# Patient Record
Sex: Male | Born: 1960 | Race: White | Hispanic: No | Marital: Married | Smoking: Never smoker
Health system: Southern US, Community
[De-identification: ages and names within clinical notes are randomized; demographics above are authoritative.]

## PROBLEM LIST (undated history)

## (undated) DIAGNOSIS — I38 Endocarditis, valve unspecified: Secondary | ICD-10-CM

## (undated) DIAGNOSIS — Z8739 Personal history of other diseases of the musculoskeletal system and connective tissue: Secondary | ICD-10-CM

## (undated) DIAGNOSIS — Z87442 Personal history of urinary calculi: Secondary | ICD-10-CM

## (undated) DIAGNOSIS — G4730 Sleep apnea, unspecified: Secondary | ICD-10-CM

## (undated) DIAGNOSIS — I10 Essential (primary) hypertension: Secondary | ICD-10-CM

## (undated) DIAGNOSIS — K219 Gastro-esophageal reflux disease without esophagitis: Secondary | ICD-10-CM

## (undated) DIAGNOSIS — I499 Cardiac arrhythmia, unspecified: Secondary | ICD-10-CM

## (undated) DIAGNOSIS — E785 Hyperlipidemia, unspecified: Secondary | ICD-10-CM

## (undated) HISTORY — DX: Gastro-esophageal reflux disease without esophagitis: K21.9

## (undated) HISTORY — DX: Cardiac arrhythmia, unspecified: I49.9

## (undated) HISTORY — DX: Endocarditis, valve unspecified: I38

## (undated) HISTORY — DX: Personal history of other diseases of the musculoskeletal system and connective tissue: Z87.39

## (undated) HISTORY — DX: Sleep apnea, unspecified: G47.30

## (undated) HISTORY — DX: Personal history of urinary calculi: Z87.442

## (undated) HISTORY — DX: Hyperlipidemia, unspecified: E78.5

## (undated) HISTORY — DX: Essential (primary) hypertension: I10

---

## 2004-12-08 ENCOUNTER — Ambulatory Visit: Payer: Self-pay | Admitting: Gastroenterology

## 2007-03-28 ENCOUNTER — Ambulatory Visit: Payer: Self-pay

## 2007-04-16 ENCOUNTER — Ambulatory Visit: Payer: Self-pay

## 2009-02-01 ENCOUNTER — Ambulatory Visit: Payer: Self-pay | Admitting: Specialist

## 2010-11-07 ENCOUNTER — Ambulatory Visit: Payer: Self-pay | Admitting: Internal Medicine

## 2010-11-12 DIAGNOSIS — Z87828 Personal history of other (healed) physical injury and trauma: Secondary | ICD-10-CM

## 2010-11-12 HISTORY — DX: Personal history of other (healed) physical injury and trauma: Z87.828

## 2010-11-30 ENCOUNTER — Emergency Department: Payer: Self-pay | Admitting: *Deleted

## 2011-02-02 ENCOUNTER — Other Ambulatory Visit: Payer: Self-pay | Admitting: Internal Medicine

## 2011-02-03 MED ORDER — AMLODIPINE-VALSARTAN-HCTZ 10-160-12.5 MG PO TABS: 1.0000 | ORAL_TABLET | Freq: Every day | ORAL | Status: DC

## 2011-02-07 ENCOUNTER — Other Ambulatory Visit: Payer: Self-pay | Admitting: Internal Medicine

## 2011-02-07 MED ORDER — AMLODIPINE-VALSARTAN-HCTZ 10-160-12.5 MG PO TABS: 1.0000 | ORAL_TABLET | Freq: Every day | ORAL | Status: DC

## 2011-02-10 ENCOUNTER — Other Ambulatory Visit: Payer: Self-pay | Admitting: Internal Medicine

## 2011-03-09 ENCOUNTER — Other Ambulatory Visit: Payer: Self-pay | Admitting: Internal Medicine

## 2011-07-13 ENCOUNTER — Other Ambulatory Visit: Payer: Self-pay | Admitting: Internal Medicine

## 2011-07-13 MED ORDER — SERTRALINE HCL 100 MG PO TABS: 100.0000 mg | ORAL_TABLET | Freq: Every day | ORAL | Status: DC

## 2011-09-12 HISTORY — PX: CARDIAC CATHETERIZATION: SHX172

## 2011-09-17 ENCOUNTER — Telehealth: Payer: Self-pay | Admitting: Internal Medicine

## 2011-09-17 NOTE — Telephone Encounter (Signed)
Caller: Allyson/Spouse; PCP: Duncan Dull; CB#: [phone]. Wife reports this gentleman was dizzy last week while they were on vacation. Caller is able to be up and about. Sxs persits today and caller would like an appt for him to see MD. Also has sxs of URI. Home Care per Dizziness Protocol, appt scheduled for Tues 5/7 at 9am with Dr Darrick Huntsman. Caller is agreeable.

## 2011-09-18 ENCOUNTER — Encounter: Payer: Self-pay | Admitting: Internal Medicine

## 2011-09-18 ENCOUNTER — Ambulatory Visit (INDEPENDENT_AMBULATORY_CARE_PROVIDER_SITE_OTHER): Payer: BC Managed Care – PPO | Admitting: Internal Medicine

## 2011-09-18 VITALS — BP 168/98 | HR 80 | Temp 97.6°F | Resp 16 | Ht 77.0 in | Wt 305.5 lb

## 2011-09-18 DIAGNOSIS — I499 Cardiac arrhythmia, unspecified: Secondary | ICD-10-CM | POA: Insufficient documentation

## 2011-09-18 DIAGNOSIS — I10 Essential (primary) hypertension: Secondary | ICD-10-CM

## 2011-09-18 DIAGNOSIS — R42 Dizziness and giddiness: Secondary | ICD-10-CM

## 2011-09-18 DIAGNOSIS — Z79899 Other long term (current) drug therapy: Secondary | ICD-10-CM

## 2011-09-18 DIAGNOSIS — I38 Endocarditis, valve unspecified: Secondary | ICD-10-CM | POA: Insufficient documentation

## 2011-09-18 MED ORDER — METOPROLOL SUCCINATE ER 50 MG PO TB24: 50.0000 mg | ORAL_TABLET | Freq: Every day | ORAL | Status: DC

## 2011-09-18 MED ORDER — AMLODIPINE-VALSARTAN-HCTZ 10-160-25 MG PO TABS: 1.0000 | ORAL_TABLET | Freq: Every day | ORAL | Status: DC

## 2011-09-18 NOTE — Patient Instructions (Signed)
Your  Blood pressure is very high and may be contributing to your dizziness.  I have refilled the exforge at a slightly higher dose and have refilled the toprol at 50 mg daily..  If your dizziness does not improve with use of Simply Saline twice daily , return in one week  FASTING SO WE CAN GET LABS

## 2011-09-18 NOTE — Progress Notes (Signed)
Patient ID: Edward Jensen, male   DOB: 28-Jun-1960, 51 y.o.   MRN: [id number]  Patient Active Problem List  Diagnoses  . Valvular disease  . Arrhythmia  . Dizziness - light-headed  . Hypertension    Subjective:  CC:   Chief Complaint  Patient presents with  . Dizziness    x one week    HPI:   Edward Vanbergen Rogersis a 51 y.o. male who presents With a 1 week history of light headedness accompanied by  mild sinus congestion and rhinitis/conjunctivitis.  Deneis headahces and true vertigo. He has noted occasional palpitations on days he forgets to take his metoprolol.  He has a history of a viral URI one month ago and had a syncopal episode after coughing and choking briefly on a piece of bread.   Past Medical History  Diagnosis Date  . Valvular disease   . Arrhythmia   . Hypertension   . GERD (gastroesophageal reflux disease)   . Arrhythmia     History reviewed. No pertinent past surgical history.       The following portions of the patient's history were reviewed and updated as appropriate: Allergies, current medications, and problem list.    Review of Systems:   12 Pt  review of systems was negative except those addressed in the HPI,     History   Social History  . Marital Status: Married    Spouse Name: Allyson      Number of Children: N/A  . Years of Education: N/A   Occupational History  . carpenter    Social History Main Topics  . Smoking status: Never Smoker   . Smokeless tobacco: Current User    Types: Chew  . Alcohol Use: 2.5 oz/week    5 drink(s) per week  . Drug Use: No  . Sexually Active: Not on file   Other Topics Concern  . Not on file   Social History Narrative  . No narrative on file    Objective:  BP 168/98  Pulse 80  Temp(Src) 97.6 F (36.4 C) (Oral)  Resp 16  Ht 6\' 5"  (1.956 m)  Wt 305 lb 8 oz (138.574 kg)  BMI 36.23 kg/m2  SpO2 96%  General appearance: alert, cooperative and appears stated age Ears: normal TM's and  external ear canals both ears Throat: lips, mucosa, and tongue normal; teeth and gums normal Neck: no adenopathy, no carotid bruit, supple, symmetrical, trachea midline and thyroid not enlarged, symmetric, no tenderness/mass/nodules Back: symmetric, no curvature. ROM normal. No CVA tenderness. Lungs: clear to auscultation bilaterally Heart: regular rate and rhythm, S1, S2 normal, no murmur, click, rub or gallop Abdomen: soft, non-tender; bowel sounds normal; no masses,  no organomegaly Pulses: 2+ and symmetric Skin: Skin color, texture, turgor normal. No rashes or lesions Lymph nodes: Cervical, supraclavicular, and axillary nodes normal. Neuro: grossly nonfocal.  Normal cerebellar function, negative romberg  Assessment and Plan:  Hypertension Uncontrolled on current regimen, which mauy be contributing to his symptoms.  Will increase toprol and losartan slightly.  Return in one week.   Dizziness - light-headed His neurologic exam is normal but he has evidence of allergic rhinitis on exam and his bp is uncontrolled. Will treat for cirla URI and and treat his uncontrolled HTN. Return in one week .    Updated Medication List Outpatient Encounter Prescriptions as of 09/18/2011  Medication Sig Dispense Refill  . esomeprazole (NEXIUM) 40 MG capsule Take 40 mg by mouth daily before breakfast.      .  metoprolol succinate (TOPROL-XL) 50 MG 24 hr tablet Take 1 tablet (50 mg total) by mouth daily.  30 tablet  6  . sertraline (ZOLOFT) 100 MG tablet Take 1 tablet (100 mg total) by mouth daily.  90 tablet  3  . testosterone cypionate (DEPOTESTOTERONE CYPIONATE) 200 MG/ML injection INJECT ONE ML IM ONCE A WEEK            AS DIRECTED BY PHYSICIAN  10 mL  3  . DISCONTD: Amlodipine-Valsartan-HCTZ (EXFORGE HCT) 10-160-12.5 MG TABS Take 1 tablet by mouth daily.  30 tablet  3  . DISCONTD: metoprolol succinate (TOPROL-XL) 25 MG 24 hr tablet TAKE 1 TABLET BY MOUTH EVERY DAY  30 tablet  6  .  Amlodipine-Valsartan-HCTZ (EXFORGE HCT) 10-160-25 MG TABS Take 1 capsule by mouth daily.  30 tablet  5  . CIALIS 20 MG tablet       . clarithromycin (BIAXIN) 500 MG tablet       . promethazine-dextromethorphan (PROMETHAZINE-DM) 6.25-15 MG/5ML syrup          Orders Placed This Encounter  Procedures  . Lipid panel  . COMPLETE METABOLIC PANEL WITH GFR    No Follow-up on file.

## 2011-09-19 ENCOUNTER — Encounter: Payer: Self-pay | Admitting: Internal Medicine

## 2011-09-19 DIAGNOSIS — I10 Essential (primary) hypertension: Secondary | ICD-10-CM | POA: Insufficient documentation

## 2011-09-19 DIAGNOSIS — R42 Dizziness and giddiness: Secondary | ICD-10-CM | POA: Insufficient documentation

## 2011-09-19 NOTE — Assessment & Plan Note (Signed)
Uncontrolled on current regimen, which mauy be contributing to his symptoms.  Will increase toprol and losartan slightly.  Return in one week.

## 2011-09-19 NOTE — Assessment & Plan Note (Signed)
His neurologic exam is normal but he has evidence of allergic rhinitis on exam and his bp is uncontrolled. Will treat for cirla URI and and treat his uncontrolled HTN. Return in one week .

## 2011-09-25 ENCOUNTER — Other Ambulatory Visit: Payer: Self-pay | Admitting: Internal Medicine

## 2011-09-25 ENCOUNTER — Ambulatory Visit (INDEPENDENT_AMBULATORY_CARE_PROVIDER_SITE_OTHER): Payer: BC Managed Care – PPO | Admitting: Internal Medicine

## 2011-09-25 ENCOUNTER — Ambulatory Visit (INDEPENDENT_AMBULATORY_CARE_PROVIDER_SITE_OTHER)
Admission: RE | Admit: 2011-09-25 | Discharge: 2011-09-25 | Disposition: A | Discharge status: Home / Self Care | Payer: BC Managed Care – PPO | Source: Ambulatory Visit | Attending: Internal Medicine | Admitting: Internal Medicine

## 2011-09-25 ENCOUNTER — Encounter: Payer: Self-pay | Admitting: Internal Medicine

## 2011-09-25 VITALS — BP 138/92 | HR 84 | Temp 98.2°F | Resp 18 | Wt 298.5 lb

## 2011-09-25 DIAGNOSIS — I10 Essential (primary) hypertension: Secondary | ICD-10-CM

## 2011-09-25 DIAGNOSIS — R0600 Dyspnea, unspecified: Secondary | ICD-10-CM

## 2011-09-25 DIAGNOSIS — R42 Dizziness and giddiness: Secondary | ICD-10-CM

## 2011-09-25 DIAGNOSIS — Z87442 Personal history of urinary calculi: Secondary | ICD-10-CM | POA: Insufficient documentation

## 2011-09-25 DIAGNOSIS — Z87828 Personal history of other (healed) physical injury and trauma: Secondary | ICD-10-CM | POA: Insufficient documentation

## 2011-09-25 DIAGNOSIS — R0609 Other forms of dyspnea: Secondary | ICD-10-CM

## 2011-09-25 DIAGNOSIS — R0989 Other specified symptoms and signs involving the circulatory and respiratory systems: Secondary | ICD-10-CM

## 2011-09-25 MED ORDER — TESTOSTERONE CYPIONATE 200 MG/ML IM SOLN: 200.0000 mg | INTRAMUSCULAR | Status: DC

## 2011-09-25 NOTE — Progress Notes (Signed)
Patient ID: Edward Jensen, male   DOB: 08/13/1960, 51 y.o.   MRN: [id number]  Patient Active Problem List  Diagnoses  . Valvular disease  . Arrhythmia  . Dizziness - light-headed  . Hypertension  . H/O renal calculi  . H/O dislocation of elbow    Subjective:  CC:   Chief Complaint  Patient presents with  . Follow-up    HPI:   Edward Jensen is a 51 y.o. male who presents  for one week followup on dizziness and uncontrolled hypertension due to loss to follow up and lapse in medications. During the interim he had an episode of dzziness and chest tightness/dyspnea and was evaluated in  the ER at [facility] with an EKG which was  apparently normal per patient.  He was checked for orthostatic hypotension  told to reduce his dose of metoprolol for unclear reasons, which he did not do. He was reportedly not orthostatic   No chest x ray done.  He now reports that he has had chest tightness on and off for the past two 2 weeks.  He is a very difficult historian and unfortunately his  wife is again not with him. His last Stress test was more than 4 years ago,  Reportedly normal .   Past Medical History  Diagnosis Date  . Valvular disease   . Arrhythmia   . Hypertension   . GERD (gastroesophageal reflux disease)   . Arrhythmia   . H/O renal calculi   . H/O dislocation of elbow July 2012    s/p closed reduction in ER     History reviewed. No pertinent past surgical history.       The following portions of the patient's history were reviewed and updated as appropriate: Allergies, current medications, and problem list.    Review of Systems:   12 Pt  review of systems was negative except those addressed in the HPI,     History   Social History  . Marital Status: Married    Spouse Name: Allyson      Number of Children: N/A  . Years of Education: N/A   Occupational History  . carpenter    Social History Main Topics  . Smoking status: Never Smoker   . Smokeless  tobacco: Current User    Types: Chew  . Alcohol Use: 2.5 oz/week    5 drink(s) per week  . Drug Use: No  . Sexually Active: Not on file   Other Topics Concern  . Not on file   Social History Narrative  . No narrative on file    Objective:  BP 138/92  Pulse 84  Temp(Src) 98.2 F (36.8 C) (Oral)  Resp 18  Wt 298 lb 8 oz (135.399 kg)  SpO2 96%  General appearance: alert, cooperative and appears stated age Ears: normal TM's and external ear canals both ears Throat: lips, mucosa, and tongue normal; teeth and gums normal Neck: no adenopathy, no carotid bruit, supple, symmetrical, trachea midline and thyroid not enlarged, symmetric, no tenderness/mass/nodules Back: symmetric, no curvature. ROM normal. No CVA tenderness. Lungs: clear to auscultation bilaterally Heart: regular rate and rhythm, S1, S2 normal, no murmur, click, rub or gallop Abdomen: soft, non-tender; bowel sounds normal; no masses,  no organomegaly Pulses: 2+ and symmetric Skin: Skin color, texture, turgor normal. No rashes or lesions Lymph nodes: Cervical, supraclavicular, and axillary nodes normal.  Assessment and Plan:  Dizziness - light-headed His recurrent episodes of dizziness and chest tightness are concerning  for angina.  His chest x ray showed mild cardiomegaly.  He was evaluated in the ER at the [facility] with an EKG which was reportedly normal. i am referring him to cardiology for risk stratification, as he has not had a stress test in years and no prior catheterization. .   Hypertension Uncontrolled at last visit due to lapse in medications,  His bp has improved but is not at goal on increased dose of metoprolol and maximal Exforge (amlodipine valsartan HCTZ) .  He will need a rule out of secondary causes includign RAS at follow up unless he undergoes cardiac cath and the renals can be evaluated during procedure.     Updated Medication List Outpatient Encounter Prescriptions as of 09/25/2011    Medication Sig Dispense Refill  . Amlodipine-Valsartan-HCTZ (EXFORGE HCT) 10-160-25 MG TABS Take 1 capsule by mouth daily.  30 tablet  5  . CIALIS 20 MG tablet       . clarithromycin (BIAXIN) 500 MG tablet       . esomeprazole (NEXIUM) 40 MG capsule Take 40 mg by mouth daily before breakfast.      . metoprolol succinate (TOPROL-XL) 50 MG 24 hr tablet Take 1 tablet (50 mg total) by mouth daily.  30 tablet  6  . promethazine-dextromethorphan (PROMETHAZINE-DM) 6.25-15 MG/5ML syrup       . sertraline (ZOLOFT) 100 MG tablet Take 1 tablet (100 mg total) by mouth daily.  90 tablet  3  . DISCONTD: testosterone cypionate (DEPOTESTOTERONE CYPIONATE) 200 MG/ML injection INJECT ONE ML IM ONCE A WEEK            AS DIRECTED BY PHYSICIAN  10 mL  3     Orders Placed This Encounter  Procedures  . DG Chest 2 View  . CK  . Troponin I  . CKMB  . Troponin I  . CK  . CK Total (and CKMB)  . Ambulatory referral to Cardiology    No Follow-up on file.

## 2011-09-25 NOTE — Assessment & Plan Note (Addendum)
His recurrent episodes of dizziness and chest tightness are concerning for angina.  His chest x ray showed mild cardiomegaly.  He was evaluated in the ER at the [facility] with an EKG which was reportedly normal. i am referring him to cardiology for risk stratification, as he has not had a stress test in years and no prior catheterization. [name]

## 2011-09-25 NOTE — Assessment & Plan Note (Addendum)
Uncontrolled at last visit due to lapse in medications,  His bp has improved but is not at goal on increased dose of metoprolol and maximal Exforge (amlodipine valsartan HCTZ) .  He will need a rule out of secondary causes includign RAS at follow up unless he undergoes cardiac cath and the renals can be evaluated during procedure.

## 2011-09-26 LAB — CK TOTAL AND CKMB (NOT AT ARMC): CK, MB: 1.8 ng/mL (ref 0.3–4.0)

## 2011-09-26 LAB — COMPREHENSIVE METABOLIC PANEL
AST: 28 U/L (ref 0–37)
Albumin: 4.8 g/dL (ref 3.5–5.2)
BUN: 18 mg/dL (ref 6–23)
Calcium: 9.4 mg/dL (ref 8.4–10.5)
Chloride: 98 mEq/L (ref 96–112)
Creat: 1.29 mg/dL (ref 0.50–1.35)
Glucose, Bld: 95 mg/dL (ref 70–99)
Potassium: 5.3 mEq/L (ref 3.5–5.3)

## 2011-09-26 LAB — LIPID PANEL
Cholesterol: 214 mg/dL — ABNORMAL HIGH (ref 0–200)
HDL: 33 mg/dL — ABNORMAL LOW (ref >39)
Total CHOL/HDL Ratio: 6.5 Ratio
Triglycerides: 425 mg/dL — ABNORMAL HIGH (ref <150)

## 2011-09-26 MED ORDER — ROSUVASTATIN CALCIUM 10 MG PO TABS: 10.0000 mg | ORAL_TABLET | Freq: Every day | ORAL | Status: DC

## 2011-09-26 NOTE — Progress Notes (Signed)
Addended by: Duncan Dull on: 09/26/2011 11:35 PM   Modules accepted: Orders

## 2011-10-01 ENCOUNTER — Other Ambulatory Visit: Payer: Self-pay | Admitting: *Deleted

## 2011-10-01 ENCOUNTER — Encounter: Payer: Self-pay | Admitting: Cardiovascular Disease

## 2011-10-01 ENCOUNTER — Ambulatory Visit (INDEPENDENT_AMBULATORY_CARE_PROVIDER_SITE_OTHER): Payer: BC Managed Care – PPO | Admitting: Cardiovascular Disease

## 2011-10-01 VITALS — BP 136/89 | HR 74 | Ht 77.0 in | Wt 294.0 lb

## 2011-10-01 DIAGNOSIS — R079 Chest pain, unspecified: Secondary | ICD-10-CM | POA: Insufficient documentation

## 2011-10-01 DIAGNOSIS — I38 Endocarditis, valve unspecified: Secondary | ICD-10-CM

## 2011-10-01 DIAGNOSIS — I10 Essential (primary) hypertension: Secondary | ICD-10-CM

## 2011-10-01 MED ORDER — TESTOSTERONE CYPIONATE 200 MG/ML IM SOLN: 200.0000 mg | INTRAMUSCULAR | Status: DC

## 2011-10-01 MED ORDER — ASPIRIN EC 81 MG PO TBEC: 81.0000 mg | DELAYED_RELEASE_TABLET | Freq: Every day | ORAL | Status: AC

## 2011-10-01 NOTE — Assessment & Plan Note (Signed)
The patient has refractory hypertension and currently is on 4 different blood pressure medications including a diuretic. It is possible that his diet, sleep apnea and weight might be contributing to difficulty in controlling his blood pressure. However, secondary hypertension should be evaluated as well. I will plan on performing nonselective renal artery angiography at the time of cardiac catheterization.

## 2011-10-01 NOTE — Assessment & Plan Note (Signed)
The patient's recent symptoms of exertional chest pain and dyspnea is highly worrisome for angina. He has cut down on his physical activity significantly due to the symptoms. He does have multiple risk factors for coronary artery disease including family history of premature CAD, hypertension, uncontrolled hyperlipidemia and obesity. He also uses tobacco although he does not smoke. I recommend starting aspirin 81 mg once daily. Due to his current symptoms and risk factors, I recommend proceeding with cardiac catheterization and possible coronary intervention. Risks, benefits and alternatives were discussed with the patient. I don't think a stress test would be helpful in his situation due to high pretest probability of coronary artery disease.

## 2011-10-01 NOTE — Patient Instructions (Addendum)
Left heart cath at [facility].  Follow up 2 weeks after cardiac cath.   Your physician has requested that you have a cardiac catheterization. Cardiac catheterization is used to diagnose and/or treat various heart conditions. Doctors may recommend this procedure for a number of different reasons. The most common reason is to evaluate chest pain. Chest pain can be a symptom of coronary artery disease (CAD), and cardiac catheterization can show whether plaque is narrowing or blocking your heart's arteries. This procedure is also used to evaluate the valves, as well as measure the blood flow and oxygen levels in different parts of your heart. For further information please visit [email or web address] Please follow instruction sheet, as given.  Start Aspirin 81 mg daily

## 2011-10-01 NOTE — Progress Notes (Signed)
HPI  This is a 51 year old man who is referred by Dr. Darrick Huntsman for evaluation of chest pain, dyspnea and dizziness. The patient reports prolonged history of palpitations without documented arrhythmia which has been controlled with metoprolol. He was told in the past also about an EEG heart valve. He had a stress test done many years ago but not recently. Over the last few weeks, he had few episodes of substernal chest tightness which happens at rest as well as with activities. He noticed increased exertional dyspnea and dizziness. He had not been feeling well and has cut down on his physical activities due to the symptoms. He is not aware of any previous history of ischemic heart disease. He does have prolonged history of hypertension which has been difficult to control. He has sleep apnea and uses CPAP in a regular basis. He has family history of premature coronary artery disease and was recently found to have significant hyperlipidemia with triglyceride above 400. The patient does not follow a healthy lifestyle. He does not smoke but he dips. He denies syncope or presyncope.  No Known Allergies   Current Outpatient Prescriptions on File Prior to Visit  Medication Sig Dispense Refill  . Amlodipine-Valsartan-HCTZ (EXFORGE HCT) 10-160-25 MG TABS Take 1 capsule by mouth daily.  30 tablet  5  . CIALIS 20 MG tablet Take 10 mg by mouth daily as needed.       [name] esomeprazole (NEXIUM) 40 MG capsule Take 40 mg by mouth daily before breakfast.      . metoprolol succinate (TOPROL-XL) 50 MG 24 hr tablet Take 1 tablet (50 mg total) by mouth daily.  30 tablet  6  . rosuvastatin (CRESTOR) 10 MG tablet Take 1 tablet (10 mg total) by mouth daily.  30 tablet  3  . sertraline (ZOLOFT) 100 MG tablet Take 1 tablet (100 mg total) by mouth daily.  90 tablet  3  . testosterone cypionate (DEPOTESTOTERONE CYPIONATE) 200 MG/ML injection Inject 1 mL (200 mg total) into the muscle once a week.  10 mL  3     Past Medical  History  Diagnosis Date  . Valvular disease   . Hypertension   . GERD (gastroesophageal reflux disease)   . H/O renal calculi   . H/O dislocation of elbow July 2012    s/p closed reduction in ER   . Sleep apnea     on CPAP  . Hyperlipidemia   . palpitations      History reviewed. No pertinent past surgical history.   Family History  Problem Relation Age of Onset  . Cancer Father     Lung  . Heart disease Father   . Heart attack Father   . Heart disease Paternal Grandfather   . Heart attack Mother      History   Social History  . Marital Status: Married    Spouse Name: Allyson      Number of Children: N/A  . Years of Education: N/A   Occupational History  . carpenter    Social History Main Topics  . Smoking status: Never Smoker   . Smokeless tobacco: Current User    Types: Chew  . Alcohol Use: 2.5 oz/week    5 drink(s) per week  . Drug Use: No  . Sexually Active: Not on file   Other Topics Concern  . Not on file   Social History Narrative  . No narrative on file     ROS Constitutional: Negative for fever,  chills, diaphoresis, activity change, appetite change and fatigue.  HENT: Negative for hearing loss, nosebleeds, congestion, sore throat, facial swelling, drooling, trouble swallowing, neck pain, voice change, sinus pressure and tinnitus.  Eyes: Negative for photophobia, pain, discharge and visual disturbance.  Respiratory: Negative for apnea, cough and wheezing.  Cardiovascular: Negative for  and leg swelling.  Gastrointestinal: Negative for nausea, vomiting, abdominal pain, diarrhea, constipation, blood in stool and abdominal distention.  Genitourinary: Negative for dysuria, urgency, frequency, hematuria and decreased urine volume.  Musculoskeletal: Negative for myalgias, back pain, joint swelling, arthralgias and gait problem.  Skin: Negative for color change, pallor, rash and wound.  Neurological: Negative for dizziness, tremors, seizures,  syncope, speech difficulty, weakness, light-headedness, numbness and headaches.  Psychiatric/Behavioral: Negative for suicidal ideas, hallucinations, behavioral problems and agitation. The patient is not nervous/anxious.     PHYSICAL EXAM   BP 136/89  Pulse 74  Ht 6\' 5"  (1.956 m)  Wt 294 lb (133.358 kg)  BMI 34.86 kg/m2 Constitutional: He is oriented to person, place, and time. He appears well-developed and well-nourished. No distress.  HENT: No nasal discharge.  Head: Normocephalic and atraumatic.  Eyes: Pupils are equal and round. Right eye exhibits no discharge. Left eye exhibits no discharge.  Neck: Normal range of motion. Neck supple. No JVD present. No thyromegaly present. No carotid bruits. Cardiovascular: Normal rate, regular rhythm, normal heart sounds. Exam reveals no gallop and no friction rub. No murmur heard.  Pulmonary/Chest: Effort normal and breath sounds normal. No stridor. No respiratory distress. He has no wheezes. He has no rales. He exhibits no tenderness.  Abdominal: Soft. Bowel sounds are normal. He exhibits no distension. There is no tenderness. There is no rebound and no guarding.  Musculoskeletal: Normal range of motion. He exhibits no edema and no tenderness.  Neurological: He is alert and oriented to person, place, and time. Coordination normal.  Skin: Skin is warm and dry. No rash noted. He is not diaphoretic. No erythema. No pallor.  Psychiatric: He has a normal mood and affect. His behavior is normal. Judgment and thought content normal.       EKG: Sinus  Rhythm  WITHIN NORMAL LIMITS   ASSESSMENT AND PLAN

## 2011-10-01 NOTE — Assessment & Plan Note (Signed)
The patient was told about the leaky heart valve in the past. This will be evaluated during cardiac catheterization but he might ultimately require an echocardiogram.

## 2011-10-02 LAB — BASIC METABOLIC PANEL
BUN/Creatinine Ratio: 17 (ref 9–20)
Chloride: 102 mmol/L (ref 97–108)
GFR calc Af Amer: 82 mL/min/{1.73_m2} (ref >59)
GFR calc non Af Amer: 71 mL/min/{1.73_m2} (ref >59)
Potassium: 4.2 mmol/L (ref 3.5–5.2)
Sodium: 140 mmol/L (ref 134–144)

## 2011-10-02 LAB — CBC WITH DIFFERENTIAL/PLATELET
Eos: 4 % (ref 0–7)
Eosinophils Absolute: 0.2 10*3/uL (ref 0.0–0.4)
HCT: 48.8 % (ref 37.5–51.0)
Immature Granulocytes: 0 % (ref 0–2)
Lymphocytes Absolute: 1.2 10*3/uL (ref 0.7–4.5)
MCH: 31.1 pg (ref 26.6–33.0)
MCHC: 34.6 g/dL (ref 31.5–35.7)
MCV: 90 fL (ref 79–97)
Monocytes Absolute: 0.8 10*3/uL (ref 0.1–1.0)
Neutrophils Relative %: 56 % (ref 40–74)
RDW: 13.4 % (ref 12.3–15.4)
WBC: 5.1 10*3/uL (ref 4.0–10.5)

## 2011-10-03 ENCOUNTER — Telehealth: Payer: Self-pay | Admitting: Internal Medicine

## 2011-10-03 MED ORDER — TESTOSTERONE CYPIONATE 200 MG/ML IM SOLN: 200.0000 mg | INTRAMUSCULAR | Status: DC

## 2011-10-03 NOTE — Telephone Encounter (Signed)
[phone] Pt spouse called checking on pt testrone rx. Pt is out of meds [facility]  He received his crestor not the testrone  This week Please advise

## 2011-10-05 ENCOUNTER — Ambulatory Visit: Payer: Self-pay | Admitting: Cardiovascular Disease

## 2011-10-05 DIAGNOSIS — I739 Peripheral vascular disease, unspecified: Secondary | ICD-10-CM

## 2011-10-05 DIAGNOSIS — I200 Unstable angina: Secondary | ICD-10-CM

## 2011-10-26 ENCOUNTER — Encounter: Payer: Self-pay | Admitting: Cardiovascular Disease

## 2011-10-26 ENCOUNTER — Ambulatory Visit: Payer: BC Managed Care – PPO | Admitting: Cardiovascular Disease

## 2011-10-26 ENCOUNTER — Ambulatory Visit (INDEPENDENT_AMBULATORY_CARE_PROVIDER_SITE_OTHER): Payer: BC Managed Care – PPO | Admitting: Cardiovascular Disease

## 2011-10-26 VITALS — BP 124/90 | HR 83 | Ht 77.0 in | Wt 293.0 lb

## 2011-10-26 DIAGNOSIS — I10 Essential (primary) hypertension: Secondary | ICD-10-CM

## 2011-10-26 DIAGNOSIS — I38 Endocarditis, valve unspecified: Secondary | ICD-10-CM

## 2011-10-26 DIAGNOSIS — I499 Cardiac arrhythmia, unspecified: Secondary | ICD-10-CM

## 2011-10-26 NOTE — Assessment & Plan Note (Signed)
His blood pressure is reasonably controlled on current medications. I advised him to start an exercise program and attempt weight loss. There was no evidence of renal artery stenosis on recent angiogram.

## 2011-10-26 NOTE — Patient Instructions (Addendum)
Your heart catheterization was fine.  Start an exercise program at least 4 days a week.   Follow up as needed.

## 2011-10-26 NOTE — Assessment & Plan Note (Signed)
Cardiac catheterization showed no significant mitral regurgitation or aortic stenosis. No further cardiac workup at this time. Followup with [place] as needed.

## 2011-10-26 NOTE — Progress Notes (Signed)
HPI  This is a 51 year old man who is here today for a followup visit regarding chest pain, dyspnea and dizziness. The patient reports prolonged history of palpitations without documented arrhythmia which has been controlled with metoprolol.  He does have prolonged history of hypertension which has been difficult to control. He has sleep apnea and uses CPAP in a regular basis. He has family history of premature coronary artery disease and was recently found to have significant hyperlipidemia with triglyceride above 400. The patient does not follow a healthy lifestyle. He does not smoke but he dips.  He underwent a left heart catheterization through the right radial artery recently which showed no significant coronary artery disease. There was minor plaque buildup and luminal irregularities. Ejection fraction was normal without evidence of mitral regurgitation or aortic stenosis. Abdominal aortogram showed no evidence of aortic aneurysm or renal artery stenosis.   No Known Allergies   Current Outpatient Prescriptions on File Prior to Visit  Medication Sig Dispense Refill  . Amlodipine-Valsartan-HCTZ (EXFORGE HCT) 10-160-25 MG TABS Take 1 capsule by mouth daily.  30 tablet  5  . aspirin EC 81 MG tablet Take 1 tablet (81 mg total) by mouth daily.  90 tablet  3  . esomeprazole (NEXIUM) 40 MG capsule Take 40 mg by mouth daily before breakfast.      . metoprolol succinate (TOPROL-XL) 50 MG 24 hr tablet Take 1 tablet (50 mg total) by mouth daily.  30 tablet  6  . rosuvastatin (CRESTOR) 10 MG tablet Take 1 tablet (10 mg total) by mouth daily.  30 tablet  3  . testosterone cypionate (DEPOTESTOTERONE CYPIONATE) 200 MG/ML injection Inject 1 mL (200 mg total) into the muscle once a week.  10 mL  3  . sertraline (ZOLOFT) 100 MG tablet Take 1 tablet (100 mg total) by mouth daily.  90 tablet  3     Past Medical History  Diagnosis Date  . Valvular disease   . Hypertension   . GERD (gastroesophageal  reflux disease)   . H/O renal calculi   . H/O dislocation of elbow July 2012    s/p closed reduction in ER   . Sleep apnea     on CPAP  . Hyperlipidemia   . palpitations      Past Surgical History  Procedure Date  . Cardiac catheterization May 2013     Family History  Problem Relation Age of Onset  . Cancer Father     Lung  . Heart disease Father   . Heart attack Father   . Heart disease Paternal Grandfather   . Heart attack Mother      History   Social History  . Marital Status: Married    Spouse Name: Allyson      Number of Children: N/A  . Years of Education: N/A   Occupational History  . carpenter    Social History Main Topics  . Smoking status: Never Smoker   . Smokeless tobacco: Current User    Types: Chew  . Alcohol Use: 2.5 oz/week    5 drink(s) per week  . Drug Use: No  . Sexually Active: Not on file   Other Topics Concern  . Not on file   Social History Narrative  . No narrative on file     PHYSICAL EXAM   BP 124/90  Pulse 83  Ht 6\' 5"  (1.956 m)  Wt 293 lb (132.904 kg)  BMI 34.74 kg/m2  Constitutional: He is oriented  to person, place, and time. He appears well-developed and well-nourished. No distress.  HENT: No nasal discharge.  Head: Normocephalic and atraumatic.  Eyes: Pupils are equal and round. Right eye exhibits no discharge. Left eye exhibits no discharge.  Neck: Normal range of motion. Neck supple. No JVD present. No thyromegaly present.  Cardiovascular: Normal rate, regular rhythm, normal heart sounds. Exam reveals no gallop and no friction rub. No murmur heard.  Pulmonary/Chest: Effort normal and breath sounds normal. No stridor. No respiratory distress. He has no wheezes. He has no rales. He exhibits no tenderness.  Abdominal: Soft. Bowel sounds are normal. He exhibits no distension. There is no tenderness. There is no rebound and no guarding.  Musculoskeletal: Normal range of motion. He exhibits no edema and no tenderness.   Neurological: He is alert and oriented to person, place, and time. Coordination normal.  Skin: Skin is warm and dry. No rash noted. He is not diaphoretic. No erythema. No pallor.  Psychiatric: He has a normal mood and affect. His behavior is normal. Judgment and thought content normal.  Right radial pulse is normal with no hematoma.    EKG: Sinus  Rhythm  -  Nonspecific T-abnormality.    ASSESSMENT AND PLAN

## 2011-10-26 NOTE — Assessment & Plan Note (Signed)
This has improved significantly and is not cardiac in nature. Cardiac catheterization showed no evidence of obstructive coronary artery disease. I discussed with him the importance of lifestyle changes, healthy diet and starting a regular exercise program.

## 2012-01-15 ENCOUNTER — Other Ambulatory Visit: Payer: Self-pay | Admitting: *Deleted

## 2012-01-15 MED ORDER — ESOMEPRAZOLE MAGNESIUM 40 MG PO CPDR: 40.0000 mg | DELAYED_RELEASE_CAPSULE | Freq: Every day | ORAL | Status: DC

## 2012-02-04 ENCOUNTER — Ambulatory Visit (INDEPENDENT_AMBULATORY_CARE_PROVIDER_SITE_OTHER): Payer: BC Managed Care – PPO | Admitting: Internal Medicine

## 2012-02-04 ENCOUNTER — Telehealth: Payer: Self-pay | Admitting: Internal Medicine

## 2012-02-04 ENCOUNTER — Encounter: Payer: Self-pay | Admitting: Internal Medicine

## 2012-02-04 VITALS — BP 128/84 | HR 78 | Temp 98.4°F | Resp 12 | Wt 291.5 lb

## 2012-02-04 DIAGNOSIS — A09 Infectious gastroenteritis and colitis, unspecified: Secondary | ICD-10-CM | POA: Insufficient documentation

## 2012-02-04 DIAGNOSIS — K469 Unspecified abdominal hernia without obstruction or gangrene: Secondary | ICD-10-CM | POA: Insufficient documentation

## 2012-02-04 DIAGNOSIS — R52 Pain, unspecified: Secondary | ICD-10-CM

## 2012-02-04 LAB — POCT INFLUENZA A/B: Influenza B, POC: NEGATIVE

## 2012-02-04 MED ORDER — METRONIDAZOLE 500 MG PO TABS: 500.0000 mg | ORAL_TABLET | Freq: Three times a day (TID) | ORAL | Status: DC

## 2012-02-04 MED ORDER — ESOMEPRAZOLE MAGNESIUM 40 MG PO CPDR: 40.0000 mg | DELAYED_RELEASE_CAPSULE | Freq: Two times a day (BID) | ORAL | Status: DC

## 2012-02-04 MED ORDER — CIPROFLOXACIN HCL 500 MG PO TABS: 500.0000 mg | ORAL_TABLET | Freq: Two times a day (BID) | ORAL | Status: DC

## 2012-02-04 NOTE — Telephone Encounter (Signed)
°  Caller: Caryl Ada; Patient Name: Edward Jensen; PCP: Duncan Dull (Adults only); Best Callback Phone Number: [phone]; Reason for call: patient has been sick since 01/30/12 with flu like symptoms and diarrhea.  Still having diarrhea.  Since MN last night 02/03/12 he has had 5 bowel  movements.   He last voided this AM and he has been drinking Gatoraide.  Afebrile.  Triaged Diarrhea or Other Changes in Bowel Habits.  Needs to be seen in the emergency room for  severe apin/cramping in abdomen or rectum that interferes with ability to carry out normal activities or sleep.  Appointment made for today, 02/04/12 at 1130 in with [place].

## 2012-02-04 NOTE — Assessment & Plan Note (Signed)
Chronic, secondary to blunt trauma over 5 years ago. It is non-incarcerated non-reducible, and nonpainful. Observation for now.

## 2012-02-04 NOTE — Patient Instructions (Addendum)
I have called in two prescriptions for your diarrhea to take for 7 to 10 days   Please follow the dietary recommendations to help your diarrhea resolve.

## 2012-02-04 NOTE — Progress Notes (Signed)
Patient ID: Edward Jensen, male   DOB: [birth year], 51 y.o.   MRN: [id number]  Patient Active Problem List  Diagnosis  . Valvular disease  . Arrhythmia  . Dizziness - light-headed  . Hypertension  . H/O renal calculi  . H/O dislocation of elbow  . Chest pain  . Diarrhea, infectious, adult  . Abdominal hernia    Subjective:  CC:   Chief Complaint  Patient presents with  . Diarrhea    x 5 days; no blood; immodium no help    HPI:   Edward Veney Rogersis a 51 y.o. male who presents for evaluation of diarrhea .  Patient describes sudden onset of nonbloody diarrhea which started 5 days ago accompanied by abdominal cramping. He had a fever 102 Wednesday night and had fevers again on Thursday but these have resolved. His diarrhea is described as watery nonbloody and occurs whenever he eats. He has not modified his diet very much. He does have a history of sick contacts. He helped an elderly male with Parkinson's disease at his house a day or 2 after the patient had recovered from a diarrheal illness. Body aches and chills are better. Nausea has resolved..  Wife had diarrhea the week before., with no other symptoms. 2) abdominal wall swelling.  Present for 5 years.  Started after blunt trauma to the abdomen when a 2 x 6 piece of lumber flew out of a sawmill machine and hit him  in the abdomen. Initially he was in a lot of pain and had a very large bruise on his abdomen. He was evaluated medically at the time. For the last several years now he has had a area on his abdomen that bulges at times. It is not painful.   Past Medical History  Diagnosis Date  . Valvular disease   . Hypertension   . GERD (gastroesophageal reflux disease)   . H/O renal calculi   . H/O dislocation of elbow July 2012    s/p closed reduction in ER   . Sleep apnea     on CPAP  . Hyperlipidemia   . palpitations     Past Surgical History  Procedure Date  . Cardiac catheterization May 2013         The following  portions of the patient's history were reviewed and updated as appropriate: Allergies, current medications, and problem list.    Review of Systems:   12 Pt  review of systems was negative except those addressed in the HPI,     History   Social History  . Marital Status: Married    Spouse Name: Allyson      Number of Children: N/A  . Years of Education: N/A   Occupational History  . carpenter    Social History Main Topics  . Smoking status: Never Smoker   . Smokeless tobacco: Current User    Types: Chew  . Alcohol Use: 2.5 oz/week    5 drink(s) per week     Regular  . Drug Use: No  . Sexually Active: Not on file   Other Topics Concern  . Not on file   Social History Narrative  . No narrative on file    Objective:  BP 128/84  Pulse 78  Temp 98.4 F (36.9 C) (Oral)  Resp 12  Wt 291 lb 8 oz (132.224 kg)  SpO2 96%  General appearance: alert, cooperative and appears stated age Ears: normal TM's and external ear canals both ears Throat: lips, mucosa,  and tongue normal; teeth and gums normal Neck: no adenopathy, no carotid bruit, supple, symmetrical, trachea midline and thyroid not enlarged, symmetric, no tenderness/mass/nodules Back: symmetric, no curvature. ROM normal. No CVA tenderness. Lungs: clear to auscultation bilaterally Heart: regular rate and rhythm, S1, S2 normal, no murmur, click, rub or gallop Abdomen: soft, non-tender; bowel sounds normal; no masses,  no organomegaly Pulses: 2+ and symmetric Skin: Skin color, texture, turgor normal. No rashes or lesions Lymph nodes: Cervical, supraclavicular, and axillary nodes normal.  Assessment and Plan:  Diarrhea, infectious, adult Rapid influenza was negative.  Given persistent symptoms accompaneid by cramping and fevers we will treat with ciprofloxacin and Flagyl for 7-10 days and modify diet. Prinzide given for modified diet.  Abdominal hernia Chronic, secondary to blunt trauma over 5 years ago. It is  non-incarcerated non-reducible, and nonpainful. Observation for now.   Updated Medication List Outpatient Encounter Prescriptions as of 02/04/2012  Medication Sig Dispense Refill  . Amlodipine-Valsartan-HCTZ (EXFORGE HCT) 10-160-25 MG TABS Take 1 capsule by mouth daily.  30 tablet  5  . aspirin EC 81 MG tablet Take 1 tablet (81 mg total) by mouth daily.  90 tablet  3  . esomeprazole (NEXIUM) 40 MG capsule Take 1 capsule (40 mg total) by mouth 2 (two) times daily.  60 capsule  5  . metoprolol succinate (TOPROL-XL) 50 MG 24 hr tablet Take 1 tablet (50 mg total) by mouth daily.  30 tablet  6  . rosuvastatin (CRESTOR) 10 MG tablet Take 1 tablet (10 mg total) by mouth daily.  30 tablet  3  . sertraline (ZOLOFT) 100 MG tablet Take 1 tablet (100 mg total) by mouth daily.  90 tablet  3  . testosterone cypionate (DEPOTESTOTERONE CYPIONATE) 200 MG/ML injection Inject 1 mL (200 mg total) into the muscle once a week.  10 mL  3  . DISCONTD: esomeprazole (NEXIUM) 40 MG capsule Take 1 capsule (40 mg total) by mouth daily before breakfast.  30 capsule  5  . DISCONTD: esomeprazole (NEXIUM) 40 MG capsule Take 40 mg by mouth 2 (two) times daily.      . ciprofloxacin (CIPRO) 500 MG tablet Take 1 tablet (500 mg total) by mouth 2 (two) times daily.  20 tablet  0  . metroNIDAZOLE (FLAGYL) 500 MG tablet Take 1 tablet (500 mg total) by mouth 3 (three) times daily.  21 tablet  0  . tadalafil (CIALIS) 10 MG tablet Take 10 mg by mouth daily as needed.         Orders Placed This Encounter  Procedures  . POCT Influenza A/B    No Follow-up on file.

## 2012-02-04 NOTE — Assessment & Plan Note (Signed)
Rapid influenza was negative.  Given persistent symptoms accompaneid by cramping and fevers we will treat with ciprofloxacin and Flagyl for 7-10 days and modify diet. Prinzide given for modified diet.

## 2012-02-05 ENCOUNTER — Telehealth: Payer: Self-pay | Admitting: Internal Medicine

## 2012-02-05 NOTE — Telephone Encounter (Signed)
Caller: Allyson Naeve/Spouse; Phone: [phone]; Reason for Call: Patient was seen yesterday and was giving a flu test.  Wife calling today to see if the test came back positive.  States child is having symptoms now.  Please call Allyson back either before 11a or after 12n.  She will be at a funeral 11a-12n.  Thanks

## 2012-02-05 NOTE — Telephone Encounter (Signed)
Patients wife notified the flu test was negative.

## 2012-02-07 ENCOUNTER — Telehealth: Payer: Self-pay | Admitting: Internal Medicine

## 2012-02-07 NOTE — Telephone Encounter (Signed)
Caller: [name]/Patient; Patient Name: Edward Jensen; PCP: Duncan Dull (Adults only); Best Callback Phone Number: [phone].  Called to reports ongoing "severe" diarrhea; occurs 1-2 hours after eats.  Reports 4-5 watery/pasty stools 02/06/12 and 2 so far far 02/07/12. Onset 01/30/12.  Afebrile.  Seen 02/04/12; remains on Cirpo and Flagyl and bland diet. Cramping stopped after 24 hours. Mild weakness not limiting actitivies. Voided at 0930; no color change. Addvised to see MD within 24 hours for diarrhea lasting longer than 24 hour and not improving with home care per Diarrhea or Other Changes in Bowel Habits.  No appointments remain for 02/07/12;  scheduled for 02/08/12 at 0815 with Dr Dan Humphreys.

## 2012-02-08 ENCOUNTER — Ambulatory Visit (INDEPENDENT_AMBULATORY_CARE_PROVIDER_SITE_OTHER): Payer: BC Managed Care – PPO | Admitting: Internal Medicine

## 2012-02-08 ENCOUNTER — Encounter: Payer: Self-pay | Admitting: Internal Medicine

## 2012-02-08 VITALS — BP 112/82 | HR 72 | Temp 97.8°F | Wt 288.0 lb

## 2012-02-08 DIAGNOSIS — R197 Diarrhea, unspecified: Secondary | ICD-10-CM

## 2012-02-08 DIAGNOSIS — A09 Infectious gastroenteritis and colitis, unspecified: Secondary | ICD-10-CM

## 2012-02-08 LAB — CBC WITH DIFFERENTIAL/PLATELET
Basophils Relative: 0.2 % (ref 0.0–3.0)
Eosinophils Absolute: 0.1 10*3/uL (ref 0.0–0.7)
Hemoglobin: 16.1 g/dL (ref 13.0–17.0)
Lymphocytes Relative: 21.2 % (ref 12.0–46.0)
MCHC: 33.3 g/dL (ref 30.0–36.0)
MCV: 92.9 fl (ref 78.0–100.0)
Monocytes Absolute: 0.7 10*3/uL (ref 0.1–1.0)
Neutro Abs: 5.9 10*3/uL (ref 1.4–7.7)
RBC: 5.19 Mil/uL (ref 4.22–5.81)

## 2012-02-08 LAB — COMPREHENSIVE METABOLIC PANEL
AST: 51 U/L — ABNORMAL HIGH (ref 0–37)
Alkaline Phosphatase: 61 U/L (ref 39–117)
BUN: 20 mg/dL (ref 6–23)
Creatinine, Ser: 1.3 mg/dL (ref 0.4–1.5)

## 2012-02-08 MED ORDER — DIPHENOXYLATE-ATROPINE 2.5-0.025 MG PO TABS: 1.0000 | ORAL_TABLET | Freq: Four times a day (QID) | ORAL | Status: DC | PRN

## 2012-02-08 NOTE — Assessment & Plan Note (Signed)
Symptoms most consistent with infectious diarrhea. Agree with previous evaluation and management including treatment with Cipro and Flagyl. We'll plan to continue. will send stool for culture and cdiff. Will start Lomotil to help with symptoms. Will check CBC and CMP with labs today. Patient will followup in one week.

## 2012-02-08 NOTE — Addendum Note (Signed)
Addended by: Mauri Reading on: 02/08/2012 12:22 PM   Modules accepted: Orders

## 2012-02-08 NOTE — Progress Notes (Signed)
Subjective:    Patient ID: Edward Jensen, male    DOB: Nov 02, 1960, 51 y.o.   MRN: [id number]  HPI 51 year old male presents for acute visit complaining of a 9 day history of diarrhea. He reports symptoms first began last week with sudden onset of watery diarrhea. At first he was having over 10 episodes per day of watery diarrhea. He denies any blood in his stool. He denies any fever or chills. He did initially have some abdominal cramping but this has resolved. He was seen by his primary care physician 3 days ago and treated with Cipro and Flagyl for presumed infectious diarrhea. He reports that frequency of diarrhea has improved to approximately 4-5 episodes per day, however symptoms are persistent. He denies any new onset fever, abdominal pain, nausea. He is tolerating a full diet and is trying to limit intake to bland foods. He notes that his son is ill now with similar symptoms.  Outpatient Encounter Prescriptions as of 02/08/2012  Medication Sig Dispense Refill  . Amlodipine-Valsartan-HCTZ (EXFORGE HCT) 10-160-25 MG TABS Take 1 capsule by mouth daily.  30 tablet  5  . aspirin EC 81 MG tablet Take 1 tablet (81 mg total) by mouth daily.  90 tablet  3  . ciprofloxacin (CIPRO) 500 MG tablet Take 1 tablet (500 mg total) by mouth 2 (two) times daily.  20 tablet  0  . esomeprazole (NEXIUM) 40 MG capsule Take 1 capsule (40 mg total) by mouth 2 (two) times daily.  60 capsule  5  . metoprolol succinate (TOPROL-XL) 50 MG 24 hr tablet Take 1 tablet (50 mg total) by mouth daily.  30 tablet  6  . metroNIDAZOLE (FLAGYL) 500 MG tablet Take 1 tablet (500 mg total) by mouth 3 (three) times daily.  21 tablet  0  . rosuvastatin (CRESTOR) 10 MG tablet Take 1 tablet (10 mg total) by mouth daily.  30 tablet  3  . sertraline (ZOLOFT) 100 MG tablet Take 1 tablet (100 mg total) by mouth daily.  90 tablet  3  . tadalafil (CIALIS) 10 MG tablet Take 10 mg by mouth daily as needed.      . testosterone cypionate  (DEPOTESTOTERONE CYPIONATE) 200 MG/ML injection Inject 1 mL (200 mg total) into the muscle once a week.  10 mL  3  . diphenoxylate-atropine (LOMOTIL) 2.5-0.025 MG per tablet Take 1 tablet by mouth 4 (four) times daily as needed for diarrhea or loose stools.  90 tablet  0   BP 112/82  Pulse 72  Temp 97.8 F (36.6 C) (Oral)  Wt 288 lb (130.636 kg)  Review of Systems  Constitutional: Negative for fever, chills, activity change, appetite change, fatigue and unexpected weight change.  Eyes: Negative for visual disturbance.  Respiratory: Negative for cough and shortness of breath.   Cardiovascular: Negative for chest pain, palpitations and leg swelling.  Gastrointestinal: Positive for diarrhea. Negative for abdominal pain, blood in stool, abdominal distention and anal bleeding.  Genitourinary: Negative for dysuria, urgency and difficulty urinating.  Musculoskeletal: Negative for arthralgias and gait problem.  Skin: Negative for color change and rash.  Hematological: Negative for adenopathy.  Psychiatric/Behavioral: Negative for disturbed wake/sleep cycle and dysphoric mood. The patient is not nervous/anxious.        Objective:   Physical Exam  Constitutional: He is oriented to person, place, and time. He appears well-developed and well-nourished. No distress.  HENT:  Head: Normocephalic and atraumatic.  Right Ear: External ear normal.  Left Ear: External ear  normal.  Nose: Nose normal.  Mouth/Throat: Oropharynx is clear and moist. No oropharyngeal exudate.  Eyes: Conjunctivae normal and EOM are normal. Pupils are equal, round, and reactive to light. Right eye exhibits no discharge. Left eye exhibits no discharge. No scleral icterus.  Neck: Normal range of motion. Neck supple. No tracheal deviation present. No thyromegaly present.  Cardiovascular: Normal rate and regular rhythm.   Pulmonary/Chest: Effort normal and breath sounds normal.  Abdominal: Soft. Bowel sounds are normal. He  exhibits no distension and no mass. There is no tenderness. There is no rebound and no guarding.  Musculoskeletal: Normal range of motion. He exhibits no edema.  Lymphadenopathy:    He has no cervical adenopathy.  Neurological: He is alert and oriented to person, place, and time. No cranial nerve deficit. Coordination normal.  Skin: Skin is warm and dry. No rash noted. He is not diaphoretic. No erythema. No pallor.  Psychiatric: He has a normal mood and affect. His behavior is normal. Judgment and thought content normal.          Assessment & Plan:

## 2012-02-12 LAB — STOOL CULTURE

## 2012-02-15 ENCOUNTER — Encounter: Payer: BC Managed Care – PPO | Admitting: Internal Medicine

## 2012-02-15 DIAGNOSIS — Z0289 Encounter for other administrative examinations: Secondary | ICD-10-CM

## 2012-02-16 LAB — CLOSTRIDIUM DIFFICILE CULTURE-FECAL

## 2012-03-05 ENCOUNTER — Other Ambulatory Visit: Payer: Self-pay | Admitting: Internal Medicine

## 2012-03-18 ENCOUNTER — Other Ambulatory Visit: Payer: Self-pay

## 2012-03-18 MED ORDER — SERTRALINE HCL 100 MG PO TABS: 100.0000 mg | ORAL_TABLET | Freq: Every day | ORAL | Status: DC

## 2012-03-18 NOTE — Telephone Encounter (Signed)
Crestor 10 mg # 90 0 R sent electronic to [facility], patient notified to make follow up appt for further refills.

## 2012-03-19 ENCOUNTER — Other Ambulatory Visit: Payer: Self-pay

## 2012-03-19 DIAGNOSIS — Z79899 Other long term (current) drug therapy: Secondary | ICD-10-CM

## 2012-03-19 MED ORDER — ROSUVASTATIN CALCIUM 10 MG PO TABS: 10.0000 mg | ORAL_TABLET | Freq: Every day | ORAL | Status: DC

## 2012-03-19 NOTE — Telephone Encounter (Signed)
Yes, but he had mildly elevated liver enzymes last month during his diarrheal episode and needs a repeat hepatic panel ASAP.  I am authorizing only 30 days only on the crestor. No refills

## 2012-03-19 NOTE — Telephone Encounter (Signed)
Refill request for Crestor 10 mg # 30 3 R. Patient has a no shoe on 02/15/12. Ok to refill?

## 2012-03-20 ENCOUNTER — Other Ambulatory Visit: Payer: Self-pay

## 2012-03-20 MED ORDER — ROSUVASTATIN CALCIUM 10 MG PO TABS: 10.0000 mg | ORAL_TABLET | Freq: Every day | ORAL | Status: DC

## 2012-03-20 NOTE — Telephone Encounter (Signed)
Patient advised that he need appt for Hepatic panel for further refills.

## 2012-03-21 NOTE — Telephone Encounter (Signed)
Spoke to patient he stated he will come in next week to have the repeat Hepatic test done.

## 2012-03-28 ENCOUNTER — Other Ambulatory Visit (INDEPENDENT_AMBULATORY_CARE_PROVIDER_SITE_OTHER): Payer: BC Managed Care – PPO | Admitting: *Deleted

## 2012-03-28 DIAGNOSIS — Z79899 Other long term (current) drug therapy: Secondary | ICD-10-CM

## 2012-03-28 DIAGNOSIS — I10 Essential (primary) hypertension: Secondary | ICD-10-CM

## 2012-03-28 LAB — COMPREHENSIVE METABOLIC PANEL
ALT: 25 U/L (ref 0–53)
Albumin: 4.4 g/dL (ref 3.5–5.2)
Alkaline Phosphatase: 57 U/L (ref 39–117)
Potassium: 4.2 mEq/L (ref 3.5–5.1)
Sodium: 139 mEq/L (ref 135–145)
Total Bilirubin: 0.9 mg/dL (ref 0.3–1.2)
Total Protein: 7.6 g/dL (ref 6.0–8.3)

## 2012-03-28 LAB — LIPID PANEL
Cholesterol: 158 mg/dL (ref 0–200)
LDL Cholesterol: 90 mg/dL (ref 0–99)
Triglycerides: 185 mg/dL — ABNORMAL HIGH (ref 0.0–149.0)
VLDL: 37 mg/dL (ref 0.0–40.0)

## 2012-03-29 LAB — COMPLETE METABOLIC PANEL WITH GFR
AST: 17 U/L (ref 0–37)
Albumin: 4.5 g/dL (ref 3.5–5.2)
Alkaline Phosphatase: 56 U/L (ref 39–117)
BUN: 14 mg/dL (ref 6–23)
Calcium: 9.2 mg/dL (ref 8.4–10.5)
Chloride: 100 mEq/L (ref 96–112)
Creat: 1.15 mg/dL (ref 0.50–1.35)
GFR, Est Non African American: 74 mL/min
Glucose, Bld: 106 mg/dL — ABNORMAL HIGH (ref 70–99)
Potassium: 4.3 mEq/L (ref 3.5–5.3)

## 2012-04-16 ENCOUNTER — Other Ambulatory Visit: Payer: Self-pay

## 2012-04-16 NOTE — Telephone Encounter (Signed)
Refill request for Testosterone 200 mg ok to refill?

## 2012-04-18 ENCOUNTER — Encounter: Payer: Self-pay | Admitting: Internal Medicine

## 2012-04-18 ENCOUNTER — Other Ambulatory Visit: Payer: Self-pay

## 2012-04-18 ENCOUNTER — Ambulatory Visit (INDEPENDENT_AMBULATORY_CARE_PROVIDER_SITE_OTHER): Payer: BC Managed Care – PPO | Admitting: Internal Medicine

## 2012-04-18 VITALS — BP 142/92 | HR 83 | Temp 97.9°F | Resp 12 | Ht 76.0 in | Wt 296.2 lb

## 2012-04-18 DIAGNOSIS — F411 Generalized anxiety disorder: Secondary | ICD-10-CM

## 2012-04-18 DIAGNOSIS — N529 Male erectile dysfunction, unspecified: Secondary | ICD-10-CM

## 2012-04-18 DIAGNOSIS — E8881 Metabolic syndrome: Secondary | ICD-10-CM

## 2012-04-18 DIAGNOSIS — I10 Essential (primary) hypertension: Secondary | ICD-10-CM

## 2012-04-18 DIAGNOSIS — Z23 Encounter for immunization: Secondary | ICD-10-CM

## 2012-04-18 DIAGNOSIS — E781 Pure hyperglyceridemia: Secondary | ICD-10-CM

## 2012-04-18 MED ORDER — TESTOSTERONE CYPIONATE 200 MG/ML IM SOLN: 200.0000 mg | INTRAMUSCULAR | Status: DC

## 2012-04-18 MED ORDER — CITALOPRAM HYDROBROMIDE 40 MG PO TABS: 40.0000 mg | ORAL_TABLET | Freq: Every day | ORAL | Status: DC

## 2012-04-18 NOTE — Progress Notes (Signed)
Patient ID: Edward Jensen, male   DOB: 03-Feb-1961, 51 y.o.   MRN: [id number]   Patient Active Problem List  Diagnosis  . Valvular disease  . Arrhythmia  . Hypertension  . H/O renal calculi  . H/O dislocation of elbow  . Abdominal hernia  . Generalized anxiety disorder  . Male erectile dysfunction  . Primary hypertriglyceridemia  . Metabolic syndrome    Subjective:  CC:   Chief Complaint  Patient presents with  . Follow-up    HPI:   Edward Jensen a 51 y.o. male who presents  For Follow up on chronic conditions including hyperlipidemia, hypertension and generalized anxiety, previously managed with low dose sertraline.  He feels that the Zoloft is not working well. He reports increased irritability,  prone to angry outbursts with a very low threshhold for overreacting.  No panic attacks.  No recent job or family stressors. Sleeping well. Does not exercise regularly or follow specific diet,  likes to hunt .   Some excessive drinking reported, sporadic use.   Past Medical History  Diagnosis Date  . Valvular disease   . Hypertension   . GERD (gastroesophageal reflux disease)   . H/O renal calculi   . H/O dislocation of elbow July 2012    s/p closed reduction in ER   . Sleep apnea     on CPAP  . Hyperlipidemia   . palpitations     Past Surgical History  Procedure Date  . Cardiac catheterization May 2013         The following portions of the patient's history were reviewed and updated as appropriate: Allergies, current medications, and problem list.    Review of Systems:   12 Pt  review of systems was negative except those addressed in the HPI,     History   Social History  . Marital Status: Married    Spouse Name: Allyson      Number of Children: N/A  . Years of Education: N/A   Occupational History  . carpenter    Social History Main Topics  . Smoking status: Never Smoker   . Smokeless tobacco: Current User    Types: Chew  . Alcohol Use: 2.5  oz/week    5 drink(s) per week     Comment: Regular  . Drug Use: No  . Sexually Active: Not on file   Other Topics Concern  . Not on file   Social History Narrative  . No narrative on file    Objective:  BP 142/92  Pulse 83  Temp 97.9 F (36.6 C) (Oral)  Resp 12  Ht 6\' 4"  (1.93 m)  Wt 296 lb 4 oz (134.378 kg)  BMI 36.06 kg/m2  SpO2 96%  General appearance: alert, cooperative and appears stated age Ears: normal TM's and external ear canals both ears Throat: lips, mucosa, and tongue normal; teeth and gums normal Neck: no adenopathy, no carotid bruit, supple, symmetrical, trachea midline and thyroid not enlarged, symmetric, no tenderness/mass/nodules Back: symmetric, no curvature. ROM normal. No CVA tenderness. Lungs: clear to auscultation bilaterally Heart: regular rate and rhythm, S1, S2 normal, no murmur, click, rub or gallop Abdomen: soft, non-tender; bowel sounds normal; no masses,  no organomegaly Pulses: 2+ and symmetric Skin: Skin color, texture, turgor normal. No rashes or lesions Lymph nodes: Cervical, supraclavicular, and axillary nodes normal.  Assessment and Plan:  Generalized anxiety disorder He has no history of obsessive or compulsive thoughts,.  Discussed changing to citalopram but wil first increase  the sertraline to 100 mg daily until current medications have been used,  Then switch to 40 mg citalopram.   Primary hypertriglyceridemia Improved with diet and exercise with reduction from 400 to 185. [name]  Patient underwent cardiac cath in June by Dr. Kirke Corin, which showed luminal irregularities, no valvular disorders, and no significant blockages.     Hypertension Elevated slightly today, bc he did not take his medications.   Continue Exforge and toprol.  Asked to check pressures at home and submit readings to office for dose adjustments.  Metabolic syndrome With hyperlipidemia, elevated fasting sugars, unhealthy eating habits including weekend overindulging  in alcohol, and BMI of 36 (some artificat since he is very muscular).  Low GI diet again recommended.  Return in 3 months for fasting lipids.    Updated Medication List Outpatient Encounter Prescriptions as of 04/18/2012  Medication Sig Dispense Refill  . aspirin EC 81 MG tablet Take 1 tablet (81 mg total) by mouth daily.  90 tablet  3  . diphenoxylate-atropine (LOMOTIL) 2.5-0.025 MG per tablet Take 1 tablet by mouth 4 (four) times daily as needed for diarrhea or loose stools.  90 tablet  0  . esomeprazole (NEXIUM) 40 MG capsule Take 1 capsule (40 mg total) by mouth 2 (two) times daily.  60 capsule  5  . EXFORGE HCT 10-160-25 MG TABS TAKE 1 CAPSULE BY MOUTH DAILY.  30 tablet  5  . metoprolol succinate (TOPROL-XL) 50 MG 24 hr tablet Take 1 tablet (50 mg total) by mouth daily.  30 tablet  6  . rosuvastatin (CRESTOR) 10 MG tablet Take 1 tablet (10 mg total) by mouth daily.  30 tablet  0  . sertraline (ZOLOFT) 100 MG tablet Take 1 tablet (100 mg total) by mouth daily.  90 tablet  0  . tadalafil (CIALIS) 10 MG tablet Take 10 mg by mouth daily as needed.      . [DISCONTINUED] ciprofloxacin (CIPRO) 500 MG tablet Take 1 tablet (500 mg total) by mouth 2 (two) times daily.  20 tablet  0  . [DISCONTINUED] metroNIDAZOLE (FLAGYL) 500 MG tablet Take 1 tablet (500 mg total) by mouth 3 (three) times daily.  21 tablet  0  . [DISCONTINUED] testosterone cypionate (DEPOTESTOTERONE CYPIONATE) 200 MG/ML injection Inject 1 mL (200 mg total) into the muscle once a week.  10 mL  3  . citalopram (CELEXA) 40 MG tablet Take 1 tablet (40 mg total) by mouth daily.  30 tablet  3  . [DISCONTINUED] citalopram (CELEXA) 40 MG tablet Take 1 tablet (40 mg total) by mouth daily.  30 tablet  3     Orders Placed This Encounter  Procedures  . Flu vaccine greater than or equal to 3yo preservative free IM    No Follow-up on file.

## 2012-04-18 NOTE — Patient Instructions (Addendum)
We are going to change your zoloft to celexa (citalopram) to see if it helps your  irritability .   You can increase the zoloft to 1.5 tablet daily until gone,  Then make the switch to celexa 40 mg once daily   Stay on your blood pressure medications.  You still need them

## 2012-04-18 NOTE — Telephone Encounter (Signed)
Rx faxed to [facility] pharmacy

## 2012-04-20 ENCOUNTER — Encounter: Payer: Self-pay | Admitting: Internal Medicine

## 2012-04-20 DIAGNOSIS — F411 Generalized anxiety disorder: Secondary | ICD-10-CM | POA: Insufficient documentation

## 2012-04-20 DIAGNOSIS — E781 Pure hyperglyceridemia: Secondary | ICD-10-CM | POA: Insufficient documentation

## 2012-04-20 DIAGNOSIS — E8881 Metabolic syndrome: Secondary | ICD-10-CM | POA: Insufficient documentation

## 2012-04-20 DIAGNOSIS — N529 Male erectile dysfunction, unspecified: Secondary | ICD-10-CM | POA: Insufficient documentation

## 2012-04-20 MED ORDER — CITALOPRAM HYDROBROMIDE 40 MG PO TABS: 40.0000 mg | ORAL_TABLET | Freq: Every day | ORAL | Status: DC

## 2012-04-20 NOTE — Assessment & Plan Note (Signed)
With hyperlipidemia, elevated fasting sugars, unhealthy eating habits including weekend overindulging in alcohol, and BMI of 36 (some artificat since he is very muscular).  Low GI diet again recommended.  Return in 3 months for fasting lipids.

## 2012-04-20 NOTE — Assessment & Plan Note (Addendum)
Improved with diet and exercise with reduction from 400 to 185. [name]  Patient underwent cardiac cath in June by Dr. Kirke Corin, which showed luminal irregularities, no valvular disorders, and no significant blockages.

## 2012-04-20 NOTE — Assessment & Plan Note (Signed)
He has no history of obsessive or compulsive thoughts,.  Discussed changing to citalopram but wil first increase the sertraline to 100 mg daily until current medications have been used,  Then switch to 40 mg citalopram.

## 2012-04-20 NOTE — Assessment & Plan Note (Addendum)
Elevated slightly today, bc he did not take his medications.   Continue Exforge and toprol.  Asked to check pressures at home and submit readings to office for dose adjustments.

## 2012-04-24 ENCOUNTER — Other Ambulatory Visit: Payer: Self-pay

## 2012-04-24 MED ORDER — ROSUVASTATIN CALCIUM 10 MG PO TABS: 10.0000 mg | ORAL_TABLET | Freq: Every day | ORAL | Status: DC

## 2012-04-24 NOTE — Telephone Encounter (Signed)
Crestor 10 mg # 30 1 R sent electronic to [facility].

## 2012-06-06 ENCOUNTER — Other Ambulatory Visit: Payer: Self-pay | Admitting: Internal Medicine

## 2012-06-06 NOTE — Telephone Encounter (Signed)
Med filled.  

## 2012-07-15 ENCOUNTER — Telehealth: Payer: Self-pay | Admitting: General Practice

## 2012-07-15 MED ORDER — ROSUVASTATIN CALCIUM 10 MG PO TABS: 10.0000 mg | ORAL_TABLET | Freq: Every day | ORAL | Status: DC

## 2012-07-15 MED ORDER — CITALOPRAM HYDROBROMIDE 40 MG PO TABS: 40.0000 mg | ORAL_TABLET | Freq: Every day | ORAL | Status: DC

## 2012-07-15 NOTE — Telephone Encounter (Signed)
I believe the testosterone was coming from another physician bc  he is on a very high dose and I have  never tested his testosterone level. So  i cannot fill it without more info.  Citalopram sent to [facility]

## 2012-07-15 NOTE — Telephone Encounter (Signed)
Pt wife Zoloft has been completed. Can you fill the Citalopram 40mg  with [facility] as per last OV note. Also can we refill the testosterone?

## 2012-07-16 NOTE — Telephone Encounter (Signed)
Pt notified to contact the urology office where testosterone was originally filled. However, Edward Jensen filled this med in December.

## 2012-07-16 NOTE — Telephone Encounter (Signed)
I will refill but he will have to have a testosterone level the day before his regular dose to check his levels bc he is on a really high dpse

## 2012-07-17 NOTE — Telephone Encounter (Signed)
LMOVM for  pt to notify him of the need to get a testosterone lab drawn.

## 2012-07-22 ENCOUNTER — Telehealth: Payer: Self-pay | Admitting: Internal Medicine

## 2012-07-22 NOTE — Telephone Encounter (Signed)
citalopram (CELEXA) 40 MG tablet   Please call into pharmacy there fax is not working. [facility] [address]

## 2012-07-23 NOTE — Telephone Encounter (Signed)
This was phoned in

## 2012-07-24 NOTE — Telephone Encounter (Signed)
Pt notified of the need to have testosterone drawn before next appt. Did not want to schedule at this time.

## 2012-08-26 ENCOUNTER — Other Ambulatory Visit (INDEPENDENT_AMBULATORY_CARE_PROVIDER_SITE_OTHER): Payer: BC Managed Care – PPO

## 2012-08-26 ENCOUNTER — Telehealth: Payer: Self-pay | Admitting: *Deleted

## 2012-08-26 ENCOUNTER — Telehealth: Payer: Self-pay | Admitting: Internal Medicine

## 2012-08-26 ENCOUNTER — Other Ambulatory Visit: Payer: Self-pay | Admitting: Internal Medicine

## 2012-08-26 DIAGNOSIS — Z79899 Other long term (current) drug therapy: Secondary | ICD-10-CM

## 2012-08-26 DIAGNOSIS — E785 Hyperlipidemia, unspecified: Secondary | ICD-10-CM

## 2012-08-26 LAB — COMPREHENSIVE METABOLIC PANEL
BUN: 19 mg/dL (ref 6–23)
CO2: 30 mEq/L (ref 19–32)
Calcium: 9.2 mg/dL (ref 8.4–10.5)
Chloride: 101 mEq/L (ref 96–112)
Creatinine, Ser: 1.2 mg/dL (ref 0.4–1.5)
GFR: 69.42 mL/min (ref >60.00)

## 2012-08-26 LAB — LIPID PANEL
HDL: 32.2 mg/dL — ABNORMAL LOW (ref >39.00)
Triglycerides: 217 mg/dL — ABNORMAL HIGH (ref 0.0–149.0)

## 2012-08-26 LAB — LDL CHOLESTEROL, DIRECT: Direct LDL: 92.3 mg/dL

## 2012-08-26 NOTE — Telephone Encounter (Signed)
Patient twill be called ,  No injection  today

## 2012-08-26 NOTE — Telephone Encounter (Signed)
Pt states he is taking Testosterone injection 5/8 and would like to know what he should be taking after his lab results come back, pt is out of the medication   Best number to contact pt  [phone]

## 2012-08-26 NOTE — Addendum Note (Signed)
Addended by: Montine Circle D on: 08/26/2012 11:57 AM   Modules accepted: Orders

## 2012-08-26 NOTE — Telephone Encounter (Signed)
Please Advise

## 2012-09-03 ENCOUNTER — Ambulatory Visit (INDEPENDENT_AMBULATORY_CARE_PROVIDER_SITE_OTHER): Payer: BC Managed Care – PPO | Admitting: Internal Medicine

## 2012-09-03 VITALS — BP 152/90 | HR 87 | Temp 98.1°F | Resp 16 | Wt 301.0 lb

## 2012-09-03 DIAGNOSIS — I10 Essential (primary) hypertension: Secondary | ICD-10-CM

## 2012-09-03 DIAGNOSIS — E8881 Metabolic syndrome: Secondary | ICD-10-CM

## 2012-09-03 DIAGNOSIS — N529 Male erectile dysfunction, unspecified: Secondary | ICD-10-CM

## 2012-09-03 NOTE — Progress Notes (Signed)
Patient ID: Edward Jensen, male   DOB: [birth year], 52 y.o.   MRN: [id number]    Patient Active Problem List  Diagnosis  . Valvular disease  . Arrhythmia  . Hypertension  . H/O renal calculi  . H/O dislocation of elbow  . Abdominal hernia  . Generalized anxiety disorder  . Male erectile dysfunction  . Primary hypertriglyceridemia  . Metabolic syndrome    Subjective:  CC:   Chief Complaint  Patient presents with  . Follow-up    for labs    HPI:   Edward Jensen a 52 y.o. male who presents for followup on testosterone replacement, hypertension and obesity. He has asked me to take over his testosterone therapy and a recent trough testosterone level was high normal suggesting that he was over treated. His blood pressure has been elevated lately. He has gained weight. He does not exercise regularly because his daily work is quite strenuous.   Past Medical History  Diagnosis Date  . Valvular disease   . Hypertension   . GERD (gastroesophageal reflux disease)   . H/O renal calculi   . H/O dislocation of elbow July 2012    s/p closed reduction in ER   . Sleep apnea     on CPAP  . Hyperlipidemia   . palpitations     Past Surgical History  Procedure Laterality Date  . Cardiac catheterization  May 2013       The following portions of the patient's history were reviewed and updated as appropriate: Allergies, current medications, and problem list.    Review of Systems:   Patient denies headache, fevers, malaise, unintentional weight loss, skin rash, eye pain, sinus congestion and sinus pain, sore throat, dysphagia,  hemoptysis , cough, dyspnea, wheezing, chest pain, palpitations, orthopnea, edema, abdominal pain, nausea, melena, diarrhea, constipation, flank pain, dysuria, hematuria, urinary  Frequency, nocturia, numbness, tingling, seizures,  Focal weakness, Loss of consciousness,  Tremor, insomnia, depression, anxiety, and suicidal ideation.     History   Social  History  . Marital Status: Married    Spouse Name: Allyson      Number of Children: N/A  . Years of Education: N/A   Occupational History  . carpenter    Social History Main Topics  . Smoking status: Never Smoker   . Smokeless tobacco: Current User    Types: Chew  . Alcohol Use: 2.5 oz/week    5 drink(s) per week     Comment: Regular  . Drug Use: No  . Sexually Active: Not on file   Other Topics Concern  . Not on file   Social History Narrative  . No narrative on file    Objective:  BP 152/90  Pulse 87  Temp(Src) 98.1 F (36.7 C) (Oral)  Resp 16  Wt 301 lb (136.533 kg)  BMI 36.65 kg/m2  SpO2 98%  General appearance: alert, cooperative and appears stated age Ears: normal TM's and external ear canals both ears Throat: lips, mucosa, and tongue normal; teeth and gums normal Neck: no adenopathy, no carotid bruit, supple, symmetrical, trachea midline and thyroid not enlarged, symmetric, no tenderness/mass/nodules Back: symmetric, no curvature. ROM normal. No CVA tenderness. Lungs: clear to auscultation bilaterally Heart: regular rate and rhythm, S1, S2 normal, no murmur, click, rub or gallop Abdomen: soft, non-tender; bowel sounds normal; no masses,  no organomegaly Pulses: 2+ and symmetric Skin: Skin color, texture, turgor normal. No rashes or lesions Lymph nodes: Cervical, supraclavicular, and axillary nodes normal.  Assessment and  Plan:  Male erectile dysfunction Given his weight gain and elevated hypertension I r reviewed the risks and benefits of testosterone use and man with multiple risk factors for coronary artery disease. He has agreed to suspend his testosterone for a few months to judge the impact on his quality of life. [name]  He can use Cialis for impotence.  Hypertension Elevated today on current regimen of Exforge and metoprolol. We will stop the testosterone to see if this is causing his elevations. If not, we will consider looking for secondary causes of  hypertension including sleep apnea.  Metabolic syndrome He has hypertriglyceridemia, obesity, elevated fasting glucoses, and hypertension. I strongly advised him to start exercising his his BMI is now 36 and he is over 300 pounds low glycemic index diet handouts given to patient today..   A total of 30 minutes was spent with patient more than half of which was spent in counseling, reviewing records and coordination of care.  Updated Medication List Outpatient Encounter Prescriptions as of 09/03/2012  Medication Sig Dispense Refill  . citalopram (CELEXA) 40 MG tablet Take 1 tablet (40 mg total) by mouth daily.  30 tablet  3  . esomeprazole (NEXIUM) 40 MG capsule Take 1 capsule (40 mg total) by mouth 2 (two) times daily.  60 capsule  5  . EXFORGE HCT 10-160-25 MG TABS TAKE 1 CAPSULE BY MOUTH DAILY.  30 tablet  5  . metoprolol succinate (TOPROL-XL) 50 MG 24 hr tablet TAKE 1 TABLET EVERY DAY  30 tablet  6  . rosuvastatin (CRESTOR) 10 MG tablet Take 1 tablet (10 mg total) by mouth daily.  30 tablet  1  . aspirin EC 81 MG tablet Take 1 tablet (81 mg total) by mouth daily.  90 tablet  3  . diphenoxylate-atropine (LOMOTIL) 2.5-0.025 MG per tablet Take 1 tablet by mouth 4 (four) times daily as needed for diarrhea or loose stools.  90 tablet  0  . tadalafil (CIALIS) 10 MG tablet Take 10 mg by mouth daily as needed.      . testosterone cypionate (DEPOTESTOTERONE CYPIONATE) 200 MG/ML injection Inject 1 mL (200 mg total) into the muscle once a week.  10 mL  3   No facility-administered encounter medications on file as of 09/03/2012.     No orders of the defined types were placed in this encounter.    Return in about 3 months (around 12/03/2012).

## 2012-09-03 NOTE — Patient Instructions (Addendum)
Let's take a 3 month break from the testosterone .    Next week get your bp checked along with pulse ,  And e mail me the readings   Get off your keister and go to the gym !!!  This is  One version of a  "Low GI"  Diet:  It will still lower your blood sugars and allow you to lose 4 to 8  lbs  per month if you follow it carefully.  Your goal with exercise is a minimum of 30 minutes of aerobic exercise 5 days per week (Walking does not count once it becomes easy!)    All of the foods can be found at grocery stores and in bulk at [employer].  The Atkins protein bars and shakes are available in more varieties at Target, WalMart and [employer]'s Foods.     7 AM Breakfast:  Choose from the following:  Low carbohydrate Protein  Shakes (I recommend the EAS AdvantEdge "Carb Control" shakes  Or the low carb shakes by Atkins.    2.5 carbs   Arnold's "Sandwhich Thin"toasted  w/ peanut butter (no jelly: about 20 net carbs  "Bagel Thin" with cream cheese and salmon: about 20 carbs   a scrambled egg/bacon/cheese burrito made with Mission's "carb balance" whole wheat tortilla  (about 10 net carbs )   Avoid cereal and bananas, oatmeal and cream of wheat and grits. They are loaded with carbohydrates!   10 AM: high protein snack  Protein bar by Atkins (the snack size, under 200 cal, usually < 6 net carbs).    A stick of cheese:  Around 1 carb,  100 cal     Dannon Light n Fit [place] Yogurt  (80 cal, 8 carbs)  Other so called "protein bars" and Greek yogurts tend to be loaded with carbohydrates.  Remember, in food advertising, the word "energy" is synonymous for " carbohydrate."  Lunch:   A Sandwich using the bread choices listed, Can use any  Eggs,  lunchmeat, grilled meat or canned tuna), avocado, regular mayo/mustard  and cheese.  A Salad using blue cheese, ranch,  Goddess or vinagrette,  No croutons or "confetti" and no "candied nuts" but regular nuts OK.   No pretzels or chips.  Pickles and miniature sweet  peppers are a good low carb alternative that provide a "crunch"  The bread is the only source of carbohydrate in a sandwich and  can be decreased by trying some of these alternatives to traditional loaf bread  Joseph's makes a pita bread and a flat bread that are 50 cal and 4 net carbs available at BJs and WalMart.  This can be toasted to use with hummous as well  Toufayan makes a low carb flatbread that's 100 cal and 9 net carbs available at [employer] and [employer] makes 2 sizes of  Low carb whole wheat tortilla  (The large one is 210 cal and 6 net carbs) Avoid "Low fat dressings, as well as Reyne Dumas and [address] dressings They are loaded with sugar!   3 PM/ Mid day  Snack:  Consider  1 ounce of  almonds, walnuts, pistachios, pecans, peanuts,  Macadamia nuts or a nut medley.  Avoid "granola"; the dried cranberries and raisins are loaded with carbohydrates. Mixed nuts as long as there are no raisins,  cranberries or dried fruit.     6 PM  Dinner:     Meat/fowl/fish with a green salad, and either broccoli, cauliflower, green  beans, spinach, brussel sprouts or  Lima beans. DO NOT BREAD THE PROTEIN!!      There is a low carb pasta by Dreamfield's that is acceptable and tastes great: only 5 digestible carbs/serving.( All grocery stores but BJs carry it )  Try Kai Levins Angelo's chicken piccata or chicken or eggplant parm over low carb pasta.(Lowes and BJs)   Clifton Custard Sanchez's "Carnitas" (pulled pork, no sauce,  0 carbs) or his beef pot roast to make a dinner burrito (at BJ's)  Pesto over low carb pasta (bj's sells a good quality pesto in the center refrigerated section of the deli   Whole wheat pasta is still full of digestible carbs and  Not as low in glycemic index as Dreamfield's.   Brown rice is still rice,  So skip the rice and noodles if you eat [place] or [place] (or at least limit to 1/2 cup)  9 PM snack :   Breyer's "low carb" fudgsicle or  ice cream bar (Carb Smart line), or  Weight  Watcher's ice cream bar , or another "no sugar added" ice cream;  a serving of fresh berries/cherries with whipped cream   Cheese or DANNON'S LlGHT N FIT GREEK YOGURT  Avoid bananas, pineapple, grapes  and watermelon on a regular basis because they are high in sugar.  THINK OF THEM AS DESSERT  Remember that snack Substitutions should be less than 10 NET carbs per serving and meals < 20 carbs. Remember to subtract fiber grams to get the "net carbs."

## 2012-09-05 ENCOUNTER — Encounter: Payer: Self-pay | Admitting: Internal Medicine

## 2012-09-05 NOTE — Assessment & Plan Note (Signed)
Elevated today on current regimen of Exforge and metoprolol. We will stop the testosterone to see if this is causing his elevations. If not, we will consider looking for secondary causes of hypertension including sleep apnea.

## 2012-09-05 NOTE — Assessment & Plan Note (Signed)
He has hypertriglyceridemia, obesity, elevated fasting glucoses, and hypertension. I strongly advised him to start exercising his his BMI is now 36 and he is over 300 pounds low glycemic index diet handouts given to patient today.[name]

## 2012-09-05 NOTE — Assessment & Plan Note (Signed)
Given his weight gain and elevated hypertension I r reviewed the risks and benefits of testosterone use and man with multiple risk factors for coronary artery disease. He has agreed to suspend his testosterone for a few months to judge the impact on his quality of life. Edward Jensen  He can use Cialis for impotence.

## 2012-09-20 ENCOUNTER — Other Ambulatory Visit: Payer: Self-pay | Admitting: Internal Medicine

## 2012-09-27 ENCOUNTER — Other Ambulatory Visit: Payer: Self-pay | Admitting: Internal Medicine

## 2012-10-02 ENCOUNTER — Encounter: Payer: BC Managed Care – PPO | Admitting: Adult Health

## 2012-10-02 ENCOUNTER — Telehealth: Payer: Self-pay | Admitting: *Deleted

## 2012-10-02 MED ORDER — HYDROCORTISONE ACE-PRAMOXINE 1-1 % RE FOAM: 1.0000 | Freq: Two times a day (BID) | RECTAL | Status: DC

## 2012-10-02 MED ORDER — SERTRALINE HCL 50 MG PO TABS: 50.0000 mg | ORAL_TABLET | Freq: Every day | ORAL | Status: DC

## 2012-10-02 NOTE — Telephone Encounter (Signed)
Patient Information:  Caller Name: Ethon  Phone: [phone]  Patient: Edward Jensen, Edward Jensen  Gender: Male  DOB: 02/10/1961  Age: 52 Years  PCP: Duncan Dull (Adults only)  Office Follow Up:  Does the office need to follow up with this patient?: No  Instructions For The Office: N/A   Symptoms  Reason For Call & Symptoms: Painful , throbbing hemorrhoids.  Reviewed Health History In EMR: Yes  Reviewed Medications In EMR: Yes  Reviewed Allergies In EMR: Yes  Reviewed Surgeries / Procedures: Yes  Date of Onset of Symptoms: 09/30/2012  Treatments Tried: Witch hazel compress, Recticare, Rubbing Alcohol  Treatments Tried Worked: No  Guideline(s) Used:  Rectal Symptoms  Disposition Per Guideline:   Go to Office Now  Reason For Disposition Reached:   Severe rectal pain  Advice Given:  Call Back If:  Rectal pain or itching lasts over 3 days  Rectal bleeding (i.e., more than just a few drops on toilet paper from wiping)  You become worse.  Patient Will Follow Care Advice:  YES  Appointment Scheduled:  10/02/2012 16:00:00 Appointment Scheduled Provider:  Orville Govern

## 2012-10-02 NOTE — Telephone Encounter (Signed)
Pt came in that stated the med you call in to take the place of zoloft is not working and he would like to go back to zoloft.  Pt wanted to thank so much for the meds you called in for [facility]

## 2012-10-02 NOTE — Telephone Encounter (Signed)
He can stop the citalopram and start the generic zoloft .  i have called it in

## 2012-10-02 NOTE — Telephone Encounter (Signed)
[facility]

## 2012-10-02 NOTE — Telephone Encounter (Signed)
     Pt came in that stated the med you call in to take the place of zoloft is not working and he would like to go back to zoloft. Pt wanted to thank so much for the meds you called in for him today  [facility]

## 2012-10-02 NOTE — Telephone Encounter (Signed)
Patient called and stated he has tried everything OTC  For hemorrhoids available and would like to know if you would please prescribe something. Last 09/03/12

## 2012-10-02 NOTE — Telephone Encounter (Signed)
Pt came by office, notified Rx was sent to the pharmacy, appointment was cancelled.  Advised to call back with persistent symptoms.

## 2012-10-03 NOTE — Progress Notes (Signed)
Patient was not seen in clinic.

## 2012-10-13 ENCOUNTER — Telehealth: Payer: Self-pay | Admitting: Internal Medicine

## 2012-10-13 ENCOUNTER — Other Ambulatory Visit: Payer: Self-pay | Admitting: Internal Medicine

## 2012-10-13 MED ORDER — HYDROCORTISONE 2.5 % RE CREA: TOPICAL_CREAM | Freq: Two times a day (BID) | RECTAL | Status: DC

## 2012-10-13 NOTE — Telephone Encounter (Signed)
anusol hc sent to pharmacy

## 2012-10-13 NOTE — Telephone Encounter (Signed)
Patient notified  As requested and would like 2% anusol

## 2012-10-13 NOTE — Telephone Encounter (Signed)
Try stool softener,  Sitz baths,  And proctofoam twice dialy,  If no results,  Needs Gen Surg evaluation bc there is nothing stronger.

## 2012-10-15 NOTE — Progress Notes (Signed)
This encounter was created in error - please disregard.

## 2012-10-20 ENCOUNTER — Telehealth: Payer: Self-pay | Admitting: *Deleted

## 2012-10-20 NOTE — Telephone Encounter (Signed)
Patient notified to schedule appointment when better.

## 2012-10-20 NOTE — Telephone Encounter (Signed)
Patient released from [facility] post fall with 6 fracture's to spine and Rib's 11 and 12 fractured while at [facility] ultra sound on kidney showed multiple stones reported by patient and cyst on left kidney patient would like to know whom he should follow up with for kidney Issue's?

## 2012-10-20 NOTE — Telephone Encounter (Signed)
When he is feeling better,  They are not urgent.

## 2012-10-21 ENCOUNTER — Encounter: Payer: Self-pay | Admitting: Internal Medicine

## 2012-10-21 DIAGNOSIS — N281 Cyst of kidney, acquired: Secondary | ICD-10-CM | POA: Insufficient documentation

## 2012-10-21 DIAGNOSIS — S2249XA Multiple fractures of ribs, unspecified side, initial encounter for closed fracture: Secondary | ICD-10-CM | POA: Insufficient documentation

## 2012-10-22 ENCOUNTER — Telehealth: Payer: Self-pay | Admitting: Internal Medicine

## 2012-10-22 ENCOUNTER — Other Ambulatory Visit: Payer: Self-pay | Admitting: Internal Medicine

## 2012-10-22 DIAGNOSIS — S2249XG Multiple fractures of ribs, unspecified side, subsequent encounter for fracture with delayed healing: Secondary | ICD-10-CM

## 2012-10-22 DIAGNOSIS — H8110 Benign paroxysmal vertigo, unspecified ear: Secondary | ICD-10-CM

## 2012-10-22 NOTE — Telephone Encounter (Signed)
Family member called stating she had missed a call from someone here at the office.

## 2012-10-23 ENCOUNTER — Other Ambulatory Visit: Payer: Self-pay | Admitting: Internal Medicine

## 2012-10-23 NOTE — Telephone Encounter (Signed)
I spoke with patient wife and Dr. Darrick Huntsman also spoke with the patient wife. That she needed to take husband back to the hospital

## 2012-10-27 ENCOUNTER — Encounter: Payer: Self-pay | Admitting: Internal Medicine

## 2012-10-29 ENCOUNTER — Telehealth: Payer: Self-pay

## 2012-10-29 NOTE — Telephone Encounter (Signed)
Pt wife came in to get her b12 injection and she mentioned the patient is doing good and that he still having a little trouble sleeping at night because of the pain. Pt had internal bleeding 15 ago and the wondering is it okay to give the patient some hydrocodone with tylenol in it, pt wife stated she had some at home.

## 2012-10-29 NOTE — Telephone Encounter (Signed)
Yes she can give him hydrocodone with tylenol for pain control.

## 2012-10-29 NOTE — Telephone Encounter (Signed)
Pt wife notified that it is okay to give the pt hydocodone with tylenol for pain control

## 2012-11-05 ENCOUNTER — Telehealth: Payer: Self-pay | Admitting: Internal Medicine

## 2012-11-05 DIAGNOSIS — K649 Unspecified hemorrhoids: Secondary | ICD-10-CM

## 2012-11-05 NOTE — Telephone Encounter (Signed)
Referral is in process as requested to Dr Lemar Livings and Evette Cristal

## 2012-11-05 NOTE — Telephone Encounter (Signed)
Pt's wife came to office today, stating pt continues to have problem with a hemorrhoid. He would like referral for possible banding or treatment, anyone but Dr. Katrinka Blazing. Please advise.

## 2012-11-05 NOTE — Telephone Encounter (Signed)
Caller: Allison/Other; Phone: [phone]; Reason for Call: Calling about referral for her husband, she said anyone but Dr Katrinka Blazing.  Wanted to get this message to Dr Melina Schools nurse.  She is the one making the refferral.  Jms/can

## 2012-11-06 NOTE — Telephone Encounter (Signed)
Pt made aware of appointment per [facility].

## 2012-11-26 ENCOUNTER — Ambulatory Visit (INDEPENDENT_AMBULATORY_CARE_PROVIDER_SITE_OTHER): Payer: BC Managed Care – PPO | Admitting: General Surgery

## 2012-11-26 ENCOUNTER — Encounter: Payer: Self-pay | Admitting: General Surgery

## 2012-11-26 VITALS — BP 110/80 | HR 84 | Resp 14 | Ht 77.0 in | Wt 287.0 lb

## 2012-11-26 DIAGNOSIS — K6289 Other specified diseases of anus and rectum: Secondary | ICD-10-CM

## 2012-11-26 LAB — HEMOCCULT GUIAC POC 1CARD (OFFICE)

## 2012-11-26 MED ORDER — HYDROCORTISONE ACETATE 25 MG RE SUPP: 25.0000 mg | Freq: Two times a day (BID) | RECTAL | Status: DC

## 2012-11-26 NOTE — Progress Notes (Signed)
Patient ID: Edward Jensen, male   DOB: 07-17-1960, 52 y.o.   MRN: [id number]  Chief Complaint  Patient presents with  . Other    hemorrhoids    HPI Edward Jensen is a 52 y.o. male here today for hemorrhoids. The patient states he started having rectal pain approximately 2 months ago. He states he has had problems in the past with hemorrhoids. He was hospitalized in June 2014 for a fall from height of 8 feet, resulting in multiple rib fractures and a near weeklong hospitalization. He was on a lot of pain medications which he believes aggravated his hemorrhoids due to constipation. He denies any rectal bleeding or other symptoms at this time.   HPI  Past Medical History  Diagnosis Date  . Valvular disease   . Hypertension   . GERD (gastroesophageal reflux disease)   . H/O renal calculi   . H/O dislocation of elbow July 2012    s/p closed reduction in ER   . Sleep apnea     on CPAP  . Hyperlipidemia   . palpitations     Past Surgical History  Procedure Laterality Date  . Cardiac catheterization  May 2013    Family History  Problem Relation Age of Onset  . Cancer Father     Lung  . Heart disease Father   . Heart attack Father   . Heart disease Paternal Grandfather   . Heart attack Mother     Social History History  Substance Use Topics  . Smoking status: Never Smoker   . Smokeless tobacco: Current User    Types: Chew  . Alcohol Use: 2.5 oz/week    5 drink(s) per week     Comment: Regular    No Known Allergies  Current Outpatient Prescriptions  Medication Sig Dispense Refill  . baclofen (LIORESAL) 10 MG tablet Take 10 mg by mouth as needed.      . CRESTOR 10 MG tablet TAKE 1 TABLET BY MOUTH EVERY DAY  30 tablet  1  . diphenoxylate-atropine (LOMOTIL) 2.5-0.025 MG per tablet Take 1 tablet by mouth 4 (four) times daily as needed for diarrhea or loose stools.  90 tablet  0  . EXFORGE HCT 10-160-25 MG TABS TAKE 1 TABLET BY MOUTH EVERY DAY  30 tablet  5  . gabapentin  (NEURONTIN) 300 MG capsule Take 1 capsule by mouth daily.      . hydrocortisone (ANUSOL-HC) 2.5 % rectal cream Place rectally 2 (two) times daily.  30 g  3  . hydrocortisone-pramoxine (PROCTOFOAM HC) rectal foam Place 1 applicator rectally 2 (two) times daily.  10 g  0  . metoprolol succinate (TOPROL-XL) 50 MG 24 hr tablet TAKE 1 TABLET EVERY DAY  30 tablet  6  . NEXIUM 40 MG capsule TAKE ONE CAPSULE BY MOUTH TWICE A DAY  60 capsule  5  . sertraline (ZOLOFT) 50 MG tablet Take 1 tablet (50 mg total) by mouth daily.  30 tablet  3  . testosterone cypionate (DEPOTESTOTERONE CYPIONATE) 200 MG/ML injection Inject 1 mL (200 mg total) into the muscle once a week.  10 mL  3  . hydrocortisone (ANUSOL-HC) 25 MG suppository Place 1 suppository (25 mg total) rectally 2 (two) times daily.  24 suppository  1   No current facility-administered medications for this visit.    Review of Systems Review of Systems  Constitutional: Negative.   Respiratory: Negative.   Cardiovascular: Negative.   Gastrointestinal: Positive for rectal pain.  Blood pressure 110/80, pulse 84, resp. rate 14, height 6\' 5"  (1.956 m), weight 287 lb (130.182 kg).  Physical Exam Physical Exam  Constitutional: He is oriented to person, place, and time. He appears well-developed and well-nourished.  Cardiovascular: Normal rate, regular rhythm and normal heart sounds.   No murmur heard. Pulmonary/Chest: Effort normal and breath sounds normal.  Abdominal: Soft. Normal appearance and bowel sounds are normal. There is no hepatosplenomegaly. There is no tenderness. No hernia.  Neurological: He is alert and oriented to person, place, and time.  Skin: Skin is warm and dry.   External anal exam showed a suggestion of fullness at the 7:00 position (dorsal lithotomy) with vigorous straining. A true thrombosed hemorrhoid was not evident. Digital rectal exam was undertaken without significant discomfort, although there is marked pain and  tenderness posteriorly suggesting an anal fissure. Stool is Hemoccult positive. Prostate was normal.  Endoscopy was completed, with the idea the fissure was present but none was visualized. Minimally prominent internal hemorrhoidal tissue is noted, no bleeding source. We'll visualize lower rectal mucosa is normal. No defined mass corresponding with a slight fullness during Valsalva maneuver was identified.  Data Reviewed EMR.  Assessment    Hemorrhoids versus anal fissure.    Plan    With the findings of Hemoccult-positive stools, I think he is a good candidate for screening colonoscopy and then an exam under anesthesia. There may well be a fissure evident which would be managed with lateral internal sphincterotomy were for hemorrhoids identified with formal excision.     Patient will be contacted once September 2014 schedule is available to arrange colonoscopy and surgery. Miralax prescription will be sent once date is arranged.   Earline Mayotte 11/26/2012, 10:56 PM

## 2012-11-26 NOTE — Patient Instructions (Addendum)
Patient to be scheduled for a colonoscopy  Colonoscopy A colonoscopy is an exam to evaluate your entire colon. In this exam, your colon is cleansed. A long fiberoptic tube is inserted through your rectum and into your colon. The fiberoptic scope (endoscope) is a long bundle of enclosed and very flexible fibers. These fibers transmit light to the area examined and send images from that area to your caregiver. Discomfort is usually minimal. You may be given a drug to help you sleep (sedative) during or prior to the procedure. This exam helps to detect lumps (tumors), polyps, inflammation, and areas of bleeding. Your caregiver may also take a small piece of tissue (biopsy) that will be examined under a microscope. LET YOUR CAREGIVER KNOW ABOUT:   Allergies to food or medicine.  Medicines taken, including vitamins, herbs, eyedrops, over-the-counter medicines, and creams.  Use of steroids (by mouth or creams).  Previous problems with anesthetics or numbing medicines.  History of bleeding problems or blood clots.  Previous surgery.  Other health problems, including diabetes and kidney problems.  Possibility of pregnancy, if this applies. BEFORE THE PROCEDURE   A clear liquid diet may be required for 2 days before the exam.  Ask your caregiver about changing or stopping your regular medications.  Liquid injections (enemas) or laxatives may be required.  A large amount of electrolyte solution may be given to you to drink over a short period of time. This solution is used to clean out your colon.  You should be present 60 minutes prior to your procedure or as directed by your caregiver. AFTER THE PROCEDURE   If you received a sedative or pain relieving medication, you will need to arrange for someone to drive you home.  Occasionally, there is a little blood passed with the first bowel movement. Do not be concerned. FINDING OUT THE RESULTS OF YOUR TEST Not all test results are available  during your visit. If your test results are not back during the visit, make an appointment with your caregiver to find out the results. Do not assume everything is normal if you have not heard from your caregiver or the medical facility. It is important for you to follow up on all of your test results. HOME CARE INSTRUCTIONS   It is not unusual to pass moderate amounts of gas and experience mild abdominal cramping following the procedure. This is due to air being used to inflate your colon during the exam. Walking or a warm pack on your belly (abdomen) may help.  You may resume all normal meals and activities after sedatives and medicines have worn off.  Only take over-the-counter or prescription medicines for pain, discomfort, or fever as directed by your caregiver. Do not use aspirin or blood thinners if a biopsy was taken. Consult your caregiver for medicine usage if biopsies were taken. SEEK IMMEDIATE MEDICAL CARE IF:   You have a fever.  You pass large blood clots or fill a toilet with blood following the procedure. This may also occur 10 to 14 days following the procedure. This is more likely if a biopsy was taken.  You develop abdominal pain that keeps getting worse and cannot be relieved with medicine. Document Released: 04/27/2000 Document Revised: 07/23/2011 Document Reviewed: 12/11/2007 [facility] Patient Information 2014 [place], [place]. [name]  Patient will be contacted once September 2014 schedule is available to arrange colonoscopy and surgery.

## 2012-12-03 ENCOUNTER — Encounter: Payer: Self-pay | Admitting: Internal Medicine

## 2012-12-03 ENCOUNTER — Ambulatory Visit (INDEPENDENT_AMBULATORY_CARE_PROVIDER_SITE_OTHER): Payer: BC Managed Care – PPO | Admitting: Internal Medicine

## 2012-12-03 VITALS — BP 132/94 | HR 96 | Temp 97.7°F | Resp 14 | Wt 286.2 lb

## 2012-12-03 DIAGNOSIS — F411 Generalized anxiety disorder: Secondary | ICD-10-CM

## 2012-12-03 DIAGNOSIS — IMO0001 Reserved for inherently not codable concepts without codable children: Secondary | ICD-10-CM

## 2012-12-03 DIAGNOSIS — N529 Male erectile dysfunction, unspecified: Secondary | ICD-10-CM

## 2012-12-03 DIAGNOSIS — S2241XD Multiple fractures of ribs, right side, subsequent encounter for fracture with routine healing: Secondary | ICD-10-CM

## 2012-12-03 DIAGNOSIS — I10 Essential (primary) hypertension: Secondary | ICD-10-CM

## 2012-12-03 DIAGNOSIS — E8881 Metabolic syndrome: Secondary | ICD-10-CM

## 2012-12-03 DIAGNOSIS — Q619 Cystic kidney disease, unspecified: Secondary | ICD-10-CM

## 2012-12-03 DIAGNOSIS — E781 Pure hyperglyceridemia: Secondary | ICD-10-CM

## 2012-12-03 DIAGNOSIS — Z79899 Other long term (current) drug therapy: Secondary | ICD-10-CM

## 2012-12-03 DIAGNOSIS — N281 Cyst of kidney, acquired: Secondary | ICD-10-CM

## 2012-12-03 MED ORDER — TESTOSTERONE CYPIONATE 200 MG/ML IM SOLN: 100.0000 mg | INTRAMUSCULAR | Status: DC

## 2012-12-03 NOTE — Patient Instructions (Addendum)
Your blood pressure is still elevated .  We will recheck it in 4 weeks when you return for your testosterone and your  Fasting cholesterol  level  You are doing great on the weight  Loss!  15 more lbs

## 2012-12-03 NOTE — Progress Notes (Signed)
Patient ID: Edward Jensen, male   DOB: 15-Aug-1960, 52 y.o.   MRN: [id number]   Patient Active Problem List   Diagnosis Date Noted  . Rectal pain 11/26/2012  . Multiple rib fractures 10/21/2012  . Renal cyst 10/21/2012  . Generalized anxiety disorder 04/20/2012  . Male erectile dysfunction 04/20/2012  . Primary hypertriglyceridemia 04/20/2012  . Metabolic syndrome 04/20/2012  . Abdominal hernia 02/04/2012  . H/O renal calculi   . H/O dislocation of elbow   . Hypertension 09/19/2011  . Arrhythmia     Subjective:  CC:   Chief Complaint  Patient presents with  . Follow-up    3 month    HPI:   Edward Schoene Rogersis a 52 y.o. male who presents for follow up on chronic conditions including hypertension hypotestosteronism obesity and anxiety . During the interim he was hospitalized at [facility] on the trauma service for one week with several fractures.   He was life flighted to [facility] after falling off a ladder from a hight of 8 feet.  He suffered 2 broken ribs, multiple pulmonary contusions, and fractures of of T12 transverse processes.  He reports that he also fractured his left shoulder blade however the discharge summary is not available. Edward Jensen He was told he had internal bleeding but did not require surgery or blood transfusions.   he was referred to physical therapy at discharge but had only one session before being discharged. He denies any persistent pain.    he was referred to Dr. Doristine Counter for rectal pain presumed to be due to thrombosed hemorrhoids. In the office and anoscopy was unable to find suspected anal fissure which was causing bleeding so he is scheduled for colonoscopy in September. He has been on a high-fiber diet since then.    regarding his obesity he has been working diligently to lose weight and is down 15 pounds despite his recent accidents and change in activity level. He has been back to work for the past 3 weeks working half days. 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 Next  Regarding his low testosterone, he has been self administering 100 mg of testosterone every other week since discharge from hospital. He notes an improvement in his energy level.    Past Medical History  Diagnosis Date  . Valvular disease   . Hypertension   . GERD (gastroesophageal reflux disease)   . H/O renal calculi   . H/O dislocation of elbow July 2012    s/p closed reduction in ER   . Sleep apnea     on CPAP  . Hyperlipidemia   . palpitations     Past Surgical History  Procedure Laterality Date  . Cardiac catheterization  May 2013       The following portions of the patient's history were reviewed and updated as appropriate: Allergies, current medications, and problem list.    Review of Systems:   12 Pt  review of systems was negative except those addressed in the HPI,     History   Social History  . Marital Status: Married    Spouse Name: Allyson      Number of Children: N/A  . Years of Education: N/A   Occupational History  . carpenter    Social History Main Topics  . Smoking status: Never Smoker   . Smokeless tobacco: Current User    Types: Chew  . Alcohol Use: 2.5 oz/week    5 drink(s) per week     Comment:  Regular  . Drug Use: No  . Sexually Active: Not on file   Other Topics Concern  . Not on file   Social History Narrative  . No narrative on file    Objective:  BP 132/94  Pulse 96  Temp(Src) 97.7 F (36.5 C) (Oral)  Resp 14  Wt 286 lb 4 oz (129.842 kg)  BMI 33.94 kg/m2  SpO2 98%  General appearance: alert, cooperative and appears stated age Ears: normal TM's and external ear canals both ears Throat: lips, mucosa, and tongue normal; teeth and gums normal Neck: no adenopathy, no carotid bruit, supple, symmetrical, trachea midline and thyroid not enlarged, symmetric, no tenderness/mass/nodules Back: symmetric, no curvature. ROM normal. No CVA tenderness. Lungs: clear to auscultation bilaterally Heart:  regular rate and rhythm, S1, S2 normal, no murmur, click, rub or gallop Abdomen: soft, non-tender; bowel sounds normal; no masses,  no organomegaly Pulses: 2+ and symmetric Skin: Skin color, texture, turgor normal. No rashes or lesions Lymph nodes: Cervical, supraclavicular, and axillary nodes normal.  Assessment and Plan:  Primary hypertriglyceridemia He has not been taking Crestor since discharge from hospital, but is willing to resume his followup lipids if indicated   Patient underwent cardiac cath in June by Dr. Kirke Corin, which showed luminal irregularities, no valvular disorders, and no significant blockages. .  he will return in 4 weeks for fasting lipids.  Metabolic syndrome He has had elevated fasting glucoses that were not diagnostic of diabetes. I have again recommended continued weight loss with a goal of 28 pounds over the next 6 months by following low glycemic index diet and engage in regular exercise . will recheck lipids fasting glucose and A1c in 4 weeks.  Multiple rib fractures His rib rib pain and pulmonary contusions are clinically resolved. He has no pain cough or shortness of breath.  Renal cyst Discussed with patient. No further workup required.  Generalized anxiety disorder Managed with sertraline. No changes today.  Male erectile dysfunction Secondary to low testosterone level. His testosterone supplementation is having managed by me per patient request. Hyperacusis dose and he has reduced even more, to 100 mg every other week. Repeat testosterone level in 4 weeks.  Hypertension His control is still borderline. Patient states his blood pressures at home have been lower than here because he has white coat hypertension. We will check a urine microalbumin to creatinine ratio when he returns in 4 weeks. If there is protein in his urine we will be more aggressive about blood pressure. His cardiac catheterization in 2013 showed mild to moderate elevation and left  ventricular end-diastolic pressures.  A total of 40 minutes was spent with patient more than half of which was spent in counseling, reviewing records from other providers and coordination of care.   Updated Medication List Outpatient Encounter Prescriptions as of 12/03/2012  Medication Sig Dispense Refill  . EXFORGE HCT 10-160-25 MG TABS TAKE 1 TABLET BY MOUTH EVERY DAY  30 tablet  5  . hydrocortisone (ANUSOL-HC) 25 MG suppository Place 1 suppository (25 mg total) rectally 2 (two) times daily.  24 suppository  1  . metoprolol succinate (TOPROL-XL) 50 MG 24 hr tablet TAKE 1 TABLET EVERY DAY  30 tablet  6  . NEXIUM 40 MG capsule TAKE ONE CAPSULE BY MOUTH TWICE A DAY  60 capsule  5  . sertraline (ZOLOFT) 50 MG tablet Take 1 tablet (50 mg total) by mouth daily.  30 tablet  3  . testosterone cypionate (DEPOTESTOTERONE CYPIONATE) 200 MG/ML injection  Inject 0.5 mLs (100 mg total) into the muscle once a week.  10 mL  3  . [DISCONTINUED] baclofen (LIORESAL) 10 MG tablet Take 10 mg by mouth as needed.      . [DISCONTINUED] CRESTOR 10 MG tablet TAKE 1 TABLET BY MOUTH EVERY DAY  30 tablet  1  . [DISCONTINUED] testosterone cypionate (DEPOTESTOTERONE CYPIONATE) 200 MG/ML injection Inject 1 mL (200 mg total) into the muscle once a week.  10 mL  3  . [DISCONTINUED] diphenoxylate-atropine (LOMOTIL) 2.5-0.025 MG per tablet Take 1 tablet by mouth 4 (four) times daily as needed for diarrhea or loose stools.  90 tablet  0  . [DISCONTINUED] gabapentin (NEURONTIN) 300 MG capsule Take 1 capsule by mouth daily.      . [DISCONTINUED] hydrocortisone (ANUSOL-HC) 2.5 % rectal cream Place rectally 2 (two) times daily.  30 g  3  . [DISCONTINUED] hydrocortisone-pramoxine (PROCTOFOAM HC) rectal foam Place 1 applicator rectally 2 (two) times daily.  10 g  0   No facility-administered encounter medications on file as of 12/03/2012.     Orders Placed This Encounter  Procedures  . Microalbumin / creatinine urine ratio  .  Lipid panel  . Hemoglobin A1c  . Comprehensive metabolic panel  . Testosterone, free, total  . PSA  . CBC with Differential    No Follow-up on file.

## 2012-12-04 ENCOUNTER — Other Ambulatory Visit: Payer: Self-pay | Admitting: Internal Medicine

## 2012-12-05 ENCOUNTER — Encounter: Payer: Self-pay | Admitting: Internal Medicine

## 2012-12-05 NOTE — Assessment & Plan Note (Signed)
Discussed with patient. No further workup required.

## 2012-12-05 NOTE — Assessment & Plan Note (Addendum)
He has not been taking Crestor since discharge from hospital, but is willing to resume his followup lipids if indicated   Patient underwent cardiac cath in June by Dr. Kirke Corin, which showed luminal irregularities, no valvular disorders, and no significant blockages. .  he will return in 4 weeks for fasting lipids.

## 2012-12-05 NOTE — Assessment & Plan Note (Signed)
He has had elevated fasting glucoses that were not diagnostic of diabetes. I have again recommended continued weight loss with a goal of 28 pounds over the next 6 months by following low glycemic index diet and engage in regular exercise . will recheck lipids fasting glucose and A1c in 4 weeks.

## 2012-12-05 NOTE — Assessment & Plan Note (Signed)
Managed with sertraline.  No changes today 

## 2012-12-05 NOTE — Assessment & Plan Note (Signed)
His rib rib pain and pulmonary contusions are clinically resolved. He has no pain cough or shortness of breath.

## 2012-12-05 NOTE — Assessment & Plan Note (Signed)
Secondary to low testosterone level. His testosterone supplementation is having managed by me per patient request. Hyperacusis dose and he has reduced even more, to 100 mg every other week. Repeat testosterone level in 4 weeks.

## 2012-12-05 NOTE — Assessment & Plan Note (Signed)
His control is still borderline. Patient states his blood pressures at home have been lower than here because he has white coat hypertension. We will check a urine microalbumin to creatinine ratio when he returns in 4 weeks. If there is protein in his urine we will be more aggressive about blood pressure. His cardiac catheterization in 2013 showed mild to moderate elevation and left ventricular end-diastolic pressures.

## 2012-12-12 ENCOUNTER — Telehealth: Payer: Self-pay | Admitting: *Deleted

## 2012-12-12 NOTE — Telephone Encounter (Signed)
Message for patient to call the office.  We need to schedule him for surgery and colonoscopy in September.

## 2012-12-22 ENCOUNTER — Telehealth: Payer: Self-pay | Admitting: *Deleted

## 2012-12-22 NOTE — Telephone Encounter (Signed)
Patient's wife was contacted today regarding colonoscopy/surgery scheduling. Caryl-Lyn had spoken to the patient's wife last Friday, 12-19-12. Patient's wife reports that patient will be having colonoscopy completed in [place] because with their insurance he will only have to pay $81. This patient is trying to decide if he would still like to have EUA/hemorrhoidectomy at [facility] because he will be starting a new job in September 2014. They would like to know how long you would anticipate him being out of work and if over 30 days from last office visit, would he require a pre-op visit?

## 2012-12-23 ENCOUNTER — Telehealth: Payer: Self-pay | Admitting: *Deleted

## 2012-12-23 NOTE — Telephone Encounter (Signed)
Anticipate RTW in 7-10 days, duration out based on findings during procedure.

## 2012-12-23 NOTE — Telephone Encounter (Signed)
Patient's wife notified as instructed. However, patient's concern is that he will be out of work more than one week and if he has to be out closer to two weeks he is not going to have surgery completed. What is his anticipated down time from work?

## 2012-12-23 NOTE — Telephone Encounter (Signed)
Detailed message was left on cell phone regarding anticipated return to work date after surgery. If patient wishes to proceed with surgery, he was instructed to give the office a call back tomorrow afternoon.   Reminder: If patient proceeds with surgery, he will need a preprocedure visit after his colonoscopy. It will be important for him to bring a copy of the colonoscopy report to that appointment.

## 2012-12-23 NOTE — Telephone Encounter (Signed)
Message copied by Nicholes Mango on Tue Dec 23, 2012  9:23 AM ------      Message from: [place], [place] W      Created: Mon Dec 22, 2012  9:24 PM       The patient will need a preprocedure visit after his colonoscopy. It will be important for him to bring a copy of the colonoscopy report to that appointment. Thank you ------

## 2012-12-29 ENCOUNTER — Telehealth: Payer: Self-pay | Admitting: Internal Medicine

## 2012-12-29 NOTE — Telephone Encounter (Signed)
[facility] .  You need to lose weight,.  Goal is 250 lbs to get your body mass index below 30 (the cutoff for obesity ).  This will lower your triglycerides. The mediterranean style low glycemic index diet is what i recommended last time and still do

## 2012-12-30 NOTE — Telephone Encounter (Signed)
Detailed message left on home voicemail per DPR. 

## 2013-01-01 ENCOUNTER — Other Ambulatory Visit: Payer: BC Managed Care – PPO

## 2013-02-22 ENCOUNTER — Other Ambulatory Visit: Payer: Self-pay | Admitting: Internal Medicine

## 2013-02-24 NOTE — Telephone Encounter (Signed)
Eprescribed.

## 2013-02-24 NOTE — Progress Notes (Signed)
This encounter was created in error - please disregard.

## 2013-02-25 ENCOUNTER — Other Ambulatory Visit: Payer: Self-pay | Admitting: General Surgery

## 2013-03-30 ENCOUNTER — Other Ambulatory Visit: Payer: Self-pay | Admitting: Internal Medicine

## 2013-03-30 NOTE — Telephone Encounter (Signed)
Last OV 12/03/12, refill? 

## 2013-03-30 NOTE — Telephone Encounter (Signed)
Ok to refill,  printed rx  

## 2013-04-16 ENCOUNTER — Telehealth: Payer: Self-pay | Admitting: *Deleted

## 2013-04-16 ENCOUNTER — Other Ambulatory Visit: Payer: Self-pay | Admitting: *Deleted

## 2013-04-16 NOTE — Telephone Encounter (Signed)
Called patient insurance for prior authorization for testosterone Insurance to fax PA form . [phone] number for prior authorization.

## 2013-04-23 ENCOUNTER — Telehealth: Payer: Self-pay | Admitting: *Deleted

## 2013-04-23 NOTE — Telephone Encounter (Signed)
Patient wife called asking for prior authorization for patients testosterone , Called insurance on file to see if I could find out a status and the insurance stated they have no coverage left message for patient to call office. FYI

## 2013-04-24 ENCOUNTER — Other Ambulatory Visit: Payer: Self-pay | Admitting: *Deleted

## 2013-04-24 NOTE — Telephone Encounter (Signed)
error 

## 2013-05-02 ENCOUNTER — Other Ambulatory Visit: Payer: Self-pay | Admitting: Internal Medicine

## 2013-06-09 ENCOUNTER — Other Ambulatory Visit: Payer: Self-pay | Admitting: Internal Medicine

## 2013-07-29 ENCOUNTER — Other Ambulatory Visit: Payer: Self-pay | Admitting: Internal Medicine

## 2013-07-29 NOTE — Telephone Encounter (Signed)
Last OV 12/03/12, refill?

## 2013-07-29 NOTE — Telephone Encounter (Signed)
30 day refill sent  No additional refills   Needs CMET , office visit fasting lipids prior to any more refills

## 2013-07-30 NOTE — Telephone Encounter (Signed)
Left message for pt, advised 30 day refill only sent, no further refills until seen in office. Advised to call back and schedule appt with Dr. Darrick Huntsmanullo and also afsting labs prior to that appt.

## 2013-08-11 ENCOUNTER — Telehealth: Payer: Self-pay | Admitting: Internal Medicine

## 2013-08-11 NOTE — Telephone Encounter (Signed)
Pt spouse came in to see if you have coupon for cailis  [facility]

## 2013-08-11 NOTE — Telephone Encounter (Signed)
Notifieed patient no coupon available at this time.

## 2013-09-02 ENCOUNTER — Other Ambulatory Visit: Payer: Self-pay | Admitting: Internal Medicine

## 2013-09-03 NOTE — Telephone Encounter (Signed)
Electronic Rx request, Patient's last office visit was 12/03/12, does not have any future appts scheduled. "Needs office visit and labs prior to any more refills" noted with last refill encounter. Please advise.

## 2013-09-04 NOTE — Telephone Encounter (Signed)
RefillED  one 30 days only.  Has not been seen in over 6 months so needs office visit prior to any more refills

## 2013-10-12 ENCOUNTER — Other Ambulatory Visit: Payer: Self-pay | Admitting: *Deleted

## 2013-10-12 NOTE — Telephone Encounter (Signed)
Left message, notifying pt again of need for appt, see earlier refill message. Requested call back to schedule appt ASAP to continue refilling medications

## 2013-10-13 ENCOUNTER — Other Ambulatory Visit: Payer: Self-pay | Admitting: *Deleted

## 2013-10-13 MED ORDER — METOPROLOL SUCCINATE ER 50 MG PO TB24: ORAL_TABLET | ORAL | Status: DC

## 2013-11-05 ENCOUNTER — Other Ambulatory Visit: Payer: Self-pay | Admitting: *Deleted

## 2013-11-05 MED ORDER — ESOMEPRAZOLE MAGNESIUM 40 MG PO CPDR: DELAYED_RELEASE_CAPSULE | ORAL | Status: DC

## 2013-12-15 ENCOUNTER — Other Ambulatory Visit: Payer: Self-pay | Admitting: *Deleted

## 2014-07-01 LAB — PSA: PSA: 0.7

## 2014-07-01 LAB — CBC AND DIFFERENTIAL
HCT: 46 % (ref 41–53)
Hemoglobin: 16.3 g/dL (ref 13.5–17.5)
Neutrophils Absolute: 5 /uL
Platelets: 212 10*3/uL (ref 150–399)
WBC: 8.1 10^3/mL

## 2014-07-19 LAB — BASIC METABOLIC PANEL
BUN: 16 mg/dL (ref 4–21)
Creatinine: 1 mg/dL (ref 0.6–1.3)
GLUCOSE: 125 mg/dL
POTASSIUM: 4.2 mmol/L (ref 3.4–5.3)
Sodium: 139 mmol/L (ref 137–147)

## 2014-07-19 LAB — LIPID PANEL
CHOLESTEROL: 208 mg/dL — AB (ref 0–200)
HDL: 34 mg/dL — AB (ref 35–70)
LDL CALC: 102 mg/dL
Triglycerides: 362 mg/dL — AB (ref 40–160)

## 2014-07-19 LAB — CBC AND DIFFERENTIAL
HEMATOCRIT: 44 % (ref 41–53)
HEMOGLOBIN: 15.1 g/dL (ref 13.5–17.5)
PLATELETS: 201 10*3/uL (ref 150–399)
WBC: 6.3 10*3/mL

## 2014-07-19 LAB — HEMOGLOBIN A1C: HEMOGLOBIN A1C: 6.7 % — AB (ref 4.0–6.0)

## 2014-07-19 LAB — TSH: TSH: 3.77 u[IU]/mL (ref 0.41–5.90)

## 2015-01-20 ENCOUNTER — Other Ambulatory Visit: Payer: Self-pay | Admitting: Internal Medicine

## 2015-01-21 NOTE — Telephone Encounter (Signed)
No PSA lab noted.  Please advise refill

## 2015-01-22 NOTE — Telephone Encounter (Signed)
Denied.  All med refills denied until he has had fasting labs and an OV.  Has not been seen since July 2014

## 2015-02-03 ENCOUNTER — Ambulatory Visit (INDEPENDENT_AMBULATORY_CARE_PROVIDER_SITE_OTHER): Payer: Self-pay | Admitting: Internal Medicine

## 2015-02-03 ENCOUNTER — Telehealth: Payer: Self-pay | Admitting: Internal Medicine

## 2015-02-03 ENCOUNTER — Encounter: Payer: Self-pay | Admitting: Internal Medicine

## 2015-02-03 VITALS — BP 148/100 | HR 82 | Temp 98.3°F | Resp 14 | Ht 77.0 in | Wt 305.5 lb

## 2015-02-03 DIAGNOSIS — F411 Generalized anxiety disorder: Secondary | ICD-10-CM

## 2015-02-03 DIAGNOSIS — E781 Pure hyperglyceridemia: Secondary | ICD-10-CM

## 2015-02-03 DIAGNOSIS — I10 Essential (primary) hypertension: Secondary | ICD-10-CM

## 2015-02-03 MED ORDER — TESTOSTERONE CYPIONATE 200 MG/ML IM SOLN: INTRAMUSCULAR | Status: DC

## 2015-02-03 NOTE — Telephone Encounter (Signed)
Pt dropped off lab results.. Placed in Dr Ceasar Lund box.. Please advise pt if any questions

## 2015-02-03 NOTE — Telephone Encounter (Signed)
Patient notified and voiced understanding script faxed.

## 2015-02-03 NOTE — Telephone Encounter (Signed)
Abs reviewd and testosterone rx written and printed.  Start with 1 ml injection every 2 weeks,  NOT WEEKLY

## 2015-02-03 NOTE — Progress Notes (Signed)
Pre-visit discussion using our clinic review tool. No additional management support is needed unless otherwise documented below in the visit note.  

## 2015-02-03 NOTE — Progress Notes (Signed)
Subjective:  Patient ID: Edward Jensen, male    DOB: March 11, 1961  Age: 54 y.o. MRN: [id number]  CC: The primary encounter diagnosis was Generalized anxiety disorder. Diagnoses of Essential hypertension and Primary hypertriglyceridemia were also pertinent to this visit.  HPI GONZALO WAYMIRE presents for follow up on multiple issues including hyperlipidemia, GAD, obesity  and  Hypertension .  Has been uninsured and was last  seen July 2014.  [facility] during [facility] period , but lost that insurance in March 2016  And now getting care through [facility] [place]  BUT UNHAPPY WITH THE CARE THERE because his doctor D/C'D THE ZOLOFT And the testosterone injections .  has not had the testosterone  resumed .  Feels miserable due to extreme fatigue,  Muscle weakness, and weight gain. .    Did not take meds today  BP has been 138/86  GERD:  History of Barrett's esophagus by last EGD.  Had EGD and colonoscopy at the [place] within the past  8 months.  One polyp found, no mention of barrett's   Dr. Rosann Auerbach Urologist last seen 2 years ago       Outpatient Prescriptions Prior to Visit  Medication Sig Dispense Refill  . CRESTOR 10 MG tablet TAKE 1 TABLET BY MOUTH EVERY DAY 30 tablet 5  . CRESTOR 10 MG tablet TAKE 1 TABLET BY MOUTH EVERY DAY 30 tablet 0  . EXFORGE HCT 10-160-25 MG TABS TAKE 1 TABLET BY MOUTH EVERY DAY 30 tablet 0  . hydrocortisone (ANUSOL-HC) 25 MG suppository PLACE 1 SUPPOSITORY (25 MG TOTAL) RECTALLY 2 (TWO) TIMES DAILY. 24 suppository 1  . metoprolol succinate (TOPROL-XL) 50 MG 24 hr tablet TAKE 1 TABLET EVERY DAY. Schedule an office visit to receive future refills 30 tablet 0  . sertraline (ZOLOFT) 50 MG tablet TAKE 1 TABLET (50 MG TOTAL) BY MOUTH DAILY. 30 tablet 5  . testosterone cypionate (DEPOTESTOTERONE CYPIONATE) 200 MG/ML injection INJECT ONE ML IM ONCE A WEEK AS DIRECTED BY PHYSICIAN 10 mL 0  . esomeprazole (NEXIUM) 40 MG capsule TAKE  ONE CAPSULE BY MOUTH TWICE A DAY (Patient not taking: Reported on 02/03/2015) 60 capsule 0   No facility-administered medications prior to visit.    Review of Systems;  Patient denies headache, fevers, malaise, unintentional weight loss, skin rash, eye pain, sinus congestion and sinus pain, sore throat, dysphagia,  hemoptysis , cough, dyspnea, wheezing, chest pain, palpitations, orthopnea, edema, abdominal pain, nausea, melena, diarrhea, constipation, flank pain, dysuria, hematuria, urinary  Frequency, nocturia, numbness, tingling, seizures,  Focal weakness, Loss of consciousness,  Tremor, insomnia, depression, anxiety, and suicidal ideation.      Objective:  BP 148/100 mmHg  Pulse 82  Temp(Src) 98.3 F (36.8 C) (Oral)  Resp 14  Ht  (1.956 m)  Wt 305 lb 8 oz (138.574 kg)  BMI 36.22 kg/m2  SpO2 96%  BP Readings from Last 3 Encounters:  02/03/15 148/100  12/03/12 132/94  11/26/12 110/80    Wt Readings from Last 3 Encounters:  02/03/15 305 lb 8 oz (138.574 kg)  12/03/12 286 lb 4 oz (129.842 kg)  11/26/12 287 lb (130.182 kg)    General appearance: alert, cooperative and appears stated age Ears: normal TM's and external ear canals both ears Throat: lips, mucosa, and tongue normal; teeth and gums normal Neck: no adenopathy, no carotid bruit, supple, symmetrical, trachea midline and thyroid not enlarged, symmetric, no tenderness/mass/nodules Back: symmetric, no  curvature. ROM normal. No CVA tenderness. Lungs: clear to auscultation bilaterally Heart: regular rate and rhythm, S1, S2 normal, no murmur, click, rub or gallop Abdomen: soft, non-tender; bowel sounds normal; no masses,  no organomegaly Pulses: 2+ and symmetric Skin: Skin color, texture, turgor normal. No rashes or lesions Lymph nodes: Cervical, supraclavicular, and axillary nodes normal.  Lab Results  Component Value Date   HGBA1C 6.7* 07/19/2014    Lab Results  Component Value Date   CREATININE 1.0  07/19/2014   CREATININE 1.2 08/26/2012   CREATININE 1.15 03/28/2012   CREATININE 1.3 03/28/2012    Lab Results  Component Value Date   WBC 6.3 07/19/2014   HGB 15.1 07/19/2014   HCT 44 07/19/2014   PLT 201 07/19/2014   GLUCOSE 90 08/26/2012   CHOL 208* 07/19/2014   TRIG 362* 07/19/2014   HDL 34* 07/19/2014   LDLDIRECT 92.3 08/26/2012   LDLCALC 102 07/19/2014   ALT 37 08/26/2012   AST 26 08/26/2012   NA 139 07/19/2014   K 4.2 07/19/2014   CL 101 08/26/2012   CREATININE 1.0 07/19/2014   BUN 16 07/19/2014   CO2 30 08/26/2012   TSH 3.77 07/19/2014   PSA 0.7 07/01/2014   INR 1.0 10/01/2011   HGBA1C 6.7* 07/19/2014    No results found.  Assessment & Plan:   Problem List Items Addressed This Visit      Unprioritized   Hypertension    BP 148/100 mmHg  Pulse 82  Temp(Src) 98.3 F (36.8 C) (Oral)  Resp 14  Ht  (1.956 m)  Wt 305 lb 8 oz (138.574 kg)  BMI 36.22 kg/m2  SpO2 96% l Not well controlled on 4 medications , but he has not taken his medications today.  Patient states his blood pressures at home have always been lower  because he has white coat hypertension. We will check a urine microalbumin to creatinine ratio .  History of cardiac catheterization in 2013 showed mild to moderate elevation and left ventricular end-diastolic pressures. Will need additional evaluation for causes of secondary hypertension (OSA, RAS)         Primary hypertriglyceridemia    Will repeat in 3 months after weight loss   Lab Results  Component Value Date   CHOL 208* 07/19/2014   HDL 34* 07/19/2014   LDLCALC 102 07/19/2014   LDLDIRECT 92.3 08/26/2012   TRIG 362* 07/19/2014   CHOLHDL 5 08/26/2012         Generalized anxiety disorder - Primary    Previously managed with sertraline.  Advised to resume, refills given          I have discontinued Mr. Tapp esomeprazole. I have also changed his testosterone cypionate. Additionally, I am having him maintain his  CRESTOR, sertraline, hydrocortisone, CRESTOR, EXFORGE HCT, metoprolol succinate, and pantoprazole.  Meds ordered this encounter  Medications  . pantoprazole (PROTONIX) 20 MG tablet    Sig: Take 20 mg by mouth 2 (two) times daily.  [name] testosterone cypionate (DEPOTESTOSTERONE CYPIONATE) 200 MG/ML injection    Sig: INJECT ONE ML IM every two weeks    Dispense:  10 mL    Refill:  3    Medications Discontinued During This Encounter  Medication Reason  . esomeprazole (NEXIUM) 40 MG capsule Change in therapy  . testosterone cypionate (DEPOTESTOTERONE CYPIONATE) 200 MG/ML injection Reorder    Follow-up: No Follow-up on file.  A total of 40 minutes was spent with patient more than half of which was spent  in counseling patient on the above mentioned issues , reviewing and explaining recent labs and imaging studies done, and coordination of care. Sherlene Shams, MD

## 2015-02-03 NOTE — Telephone Encounter (Signed)
In yellow folder abstracted. Testosterone  137

## 2015-02-03 NOTE — Patient Instructions (Addendum)
Bring back  Copies of all labs done in the past year from the [place] or the [place] [facility]  I will refill your testosterone once I see them  Your blood pressure is too high.  If it is not < 130/80 on your 4 medications,,  We will need to schedule you a sleep study at [facility] and a renal ultrasound to rule out renal artery stenosis  You need to lose 15 lbs over the next 3 months  Return to me in 3 months

## 2015-02-05 NOTE — Assessment & Plan Note (Signed)
Previously managed with sertraline.  Advised to resume, refills given

## 2015-02-06 NOTE — Assessment & Plan Note (Addendum)
BP 148/100 mmHg  Pulse 82  Temp(Src) 98.3 F (36.8 C) (Oral)  Resp 14  Ht  (1.956 m)  Wt 305 lb 8 oz (138.574 kg)  BMI 36.22 kg/m2  SpO2 96% l Not well controlled on 4 medications , but he has not taken his medications today.  Patient states his blood pressures at home have always been lower  because he has white coat hypertension. We will check a urine microalbumin to creatinine ratio .  History of cardiac catheterization in 2013 showed mild to moderate elevation and left ventricular end-diastolic pressures. Will need additional evaluation for causes of secondary hypertension (OSA, RAS)

## 2015-02-06 NOTE — Assessment & Plan Note (Signed)
Will repeat in 3 months after weight loss   Lab Results  Component Value Date   CHOL 208* 07/19/2014   HDL 34* 07/19/2014   LDLCALC 102 07/19/2014   LDLDIRECT 92.3 08/26/2012   TRIG 362* 07/19/2014   CHOLHDL 5 08/26/2012

## 2015-02-16 ENCOUNTER — Ambulatory Visit: Payer: BC Managed Care – PPO | Admitting: Internal Medicine

## 2015-08-04 ENCOUNTER — Other Ambulatory Visit: Payer: Self-pay | Admitting: Physician Assistant

## 2015-10-08 ENCOUNTER — Other Ambulatory Visit: Payer: Self-pay | Admitting: Internal Medicine

## 2015-10-11 ENCOUNTER — Telehealth: Payer: Self-pay | Admitting: *Deleted

## 2015-10-11 NOTE — Telephone Encounter (Signed)
OK to refill Testosterone last OV 10/16.

## 2015-10-11 NOTE — Telephone Encounter (Signed)
Patient requested a medication refill for his testosterone  Pharmacy [facility] [address]

## 2015-10-12 NOTE — Telephone Encounter (Signed)
Attempted to reach this patient, left a message that he needs to have labs drawn prior to confirm where he wants them as [facility] has gone [facility] in the past. thanks

## 2015-10-12 NOTE — Telephone Encounter (Signed)
Cannot do. He has not had follow up labs or ANY LABS SINCE mARCH 2016. I WILL NOT FILL. UNTIL HE HAS FATIN GLABS.  PLEASE ORDER A  CMET,  PSA, AND FASYING LIPIDS  AND CBC W DIFF

## 2015-10-13 ENCOUNTER — Telehealth: Payer: Self-pay | Admitting: Internal Medicine

## 2015-10-13 DIAGNOSIS — R5383 Other fatigue: Secondary | ICD-10-CM

## 2015-10-13 DIAGNOSIS — E8881 Metabolic syndrome: Secondary | ICD-10-CM

## 2015-10-13 DIAGNOSIS — I10 Essential (primary) hypertension: Secondary | ICD-10-CM

## 2015-10-13 DIAGNOSIS — E781 Pure hyperglyceridemia: Secondary | ICD-10-CM

## 2015-10-13 DIAGNOSIS — Z125 Encounter for screening for malignant neoplasm of prostate: Secondary | ICD-10-CM

## 2015-10-13 NOTE — Telephone Encounter (Signed)
Pt's wife called. Her husband is due for blood work. Does pt need an appointment or can Dr. Darrick Huntsmanullo put in orders so he can come here for blood work.

## 2015-10-14 NOTE — Telephone Encounter (Signed)
Left message for patient to call us back and we will set up lab appointment and put those orders in.

## 2015-10-14 NOTE — Telephone Encounter (Signed)
Per another CMA he wanted to have the labs drawn here, which is fine also

## 2015-10-14 NOTE — Telephone Encounter (Signed)
Made appointment for patient to come in and have labs drawn. Placed orders for labs.

## 2015-10-21 ENCOUNTER — Telehealth: Payer: Self-pay

## 2015-10-21 ENCOUNTER — Other Ambulatory Visit (INDEPENDENT_AMBULATORY_CARE_PROVIDER_SITE_OTHER): Payer: Self-pay

## 2015-10-21 DIAGNOSIS — R5383 Other fatigue: Secondary | ICD-10-CM

## 2015-10-21 DIAGNOSIS — E781 Pure hyperglyceridemia: Secondary | ICD-10-CM

## 2015-10-21 DIAGNOSIS — I10 Essential (primary) hypertension: Secondary | ICD-10-CM

## 2015-10-21 DIAGNOSIS — E8881 Metabolic syndrome: Secondary | ICD-10-CM

## 2015-10-21 DIAGNOSIS — Z125 Encounter for screening for malignant neoplasm of prostate: Secondary | ICD-10-CM

## 2015-10-21 LAB — CBC WITH DIFFERENTIAL/PLATELET
BASOS PCT: 0.3 % (ref 0.0–3.0)
Basophils Absolute: 0 10*3/uL (ref 0.0–0.1)
EOS PCT: 3.9 % (ref 0.0–5.0)
Eosinophils Absolute: 0.3 10*3/uL (ref 0.0–0.7)
HEMATOCRIT: 43.2 % (ref 39.0–52.0)
HEMOGLOBIN: 14.9 g/dL (ref 13.0–17.0)
LYMPHS PCT: 27.2 % (ref 12.0–46.0)
Lymphs Abs: 1.9 10*3/uL (ref 0.7–4.0)
MCHC: 34.5 g/dL (ref 30.0–36.0)
MCV: 88.8 fl (ref 78.0–100.0)
Monocytes Absolute: 0.6 10*3/uL (ref 0.1–1.0)
Monocytes Relative: 8.6 % (ref 3.0–12.0)
Neutro Abs: 4.3 10*3/uL (ref 1.4–7.7)
Neutrophils Relative %: 60 % (ref 43.0–77.0)
Platelets: 244 10*3/uL (ref 150.0–400.0)
RBC: 4.86 Mil/uL (ref 4.22–5.81)
RDW: 13.3 % (ref 11.5–15.5)
WBC: 7.1 10*3/uL (ref 4.0–10.5)

## 2015-10-21 LAB — COMPREHENSIVE METABOLIC PANEL
ALBUMIN: 4.5 g/dL (ref 3.5–5.2)
ALT: 42 U/L (ref 0–53)
AST: 89 U/L — AB (ref 0–37)
Alkaline Phosphatase: 71 U/L (ref 39–117)
BUN: 18 mg/dL (ref 6–23)
CHLORIDE: 101 meq/L (ref 96–112)
CO2: 27 meq/L (ref 19–32)
CREATININE: 1.11 mg/dL (ref 0.40–1.50)
Calcium: 9.3 mg/dL (ref 8.4–10.5)
GFR: 73.58 mL/min (ref >60.00)
Glucose, Bld: 145 mg/dL — ABNORMAL HIGH (ref 70–99)
Potassium: 4.2 mEq/L (ref 3.5–5.1)
SODIUM: 138 meq/L (ref 135–145)
Total Bilirubin: 0.9 mg/dL (ref 0.2–1.2)
Total Protein: 7.2 g/dL (ref 6.0–8.3)

## 2015-10-21 LAB — LIPID PANEL
Cholesterol: 185 mg/dL (ref 0–200)
HDL: 27.6 mg/dL — ABNORMAL LOW (ref >39.00)
Total CHOL/HDL Ratio: 7
Triglycerides: 639 mg/dL — ABNORMAL HIGH (ref 0.0–149.0)

## 2015-10-21 LAB — LDL CHOLESTEROL, DIRECT: LDL DIRECT: 64 mg/dL

## 2015-10-21 LAB — PSA: PSA: 0.41 ng/mL (ref 0.10–4.00)

## 2015-10-21 NOTE — Telephone Encounter (Signed)
Notes Recorded by Denisa L O'Brien-Blaney, LPN on 1/3/24406/01/2016 at 1:02 PM Notified patient and he verbalized understanding to suspend testosterone. He reports not taking any for about a month. States the [facility] has recently changed the majority of his medications and will drop off the most recent copy today. Notes Recorded by Sherlene Shamseresa L Tullo, MD on 10/21/2015 at 12:48 PM I recommend suspending the testosterone, because his liver enzymes are elevated and his triglycerides are nearly 700  Pt came by office with all his medications. This is the updated list: 1. Losartan 50mg  tablet, pt takes 1/2 tablet daily 2. Metoprolol Succ 100mg  SA tablet, pt takes 1/2 tablet daily 3. Sertraline HCL 100mg  tab 4. Escitalopram 10mg  tab 5. Simvastatin 40mg  tab 6. Amlodipine Besylate 10mg  tab 7. Pantoprazole NA 40mg  EC tab 8. HCTZ 25 mg tab  I have updated the pt's medication list.

## 2015-10-24 LAB — TESTOSTERONE TOTAL,FREE,BIO, MALES
ALBUMIN: 4.6 g/dL (ref 3.6–5.1)
SEX HORMONE BINDING: 8 nmol/L — AB (ref 10–50)
TESTOSTERONE BIOAVAILABLE: 36.7 ng/dL — AB (ref 130.5–681.7)
TESTOSTERONE FREE: 17.5 pg/mL — AB (ref 47.0–244.0)
TESTOSTERONE: 63 ng/dL — AB (ref 250–827)

## 2015-10-26 ENCOUNTER — Other Ambulatory Visit: Payer: Self-pay | Admitting: Internal Medicine

## 2015-10-26 DIAGNOSIS — R748 Abnormal levels of other serum enzymes: Secondary | ICD-10-CM

## 2015-11-01 ENCOUNTER — Telehealth: Payer: Self-pay | Admitting: Internal Medicine

## 2015-11-01 NOTE — Telephone Encounter (Signed)
Patient wife ask if waiting after the US to restart testosterone last message stated to hold due to elevated liver enzymes.

## 2015-11-01 NOTE — Telephone Encounter (Signed)
Yes the testosterone should not be resumed until the ultrasound has been done

## 2015-11-02 ENCOUNTER — Ambulatory Visit: Payer: Self-pay

## 2015-11-02 NOTE — Telephone Encounter (Signed)
Patient notified

## 2016-02-14 ENCOUNTER — Emergency Department
Admission: EM | Admit: 2016-02-14 | Discharge: 2016-02-15 | Disposition: A | Discharge status: Home / Self Care | Payer: Self-pay | Attending: Emergency Medicine | Admitting: Emergency Medicine

## 2016-02-14 ENCOUNTER — Emergency Department: Payer: Self-pay

## 2016-02-14 DIAGNOSIS — Z79899 Other long term (current) drug therapy: Secondary | ICD-10-CM | POA: Insufficient documentation

## 2016-02-14 DIAGNOSIS — F17220 Nicotine dependence, chewing tobacco, uncomplicated: Secondary | ICD-10-CM | POA: Insufficient documentation

## 2016-02-14 DIAGNOSIS — I10 Essential (primary) hypertension: Secondary | ICD-10-CM | POA: Insufficient documentation

## 2016-02-14 DIAGNOSIS — R55 Syncope and collapse: Secondary | ICD-10-CM | POA: Insufficient documentation

## 2016-02-14 DIAGNOSIS — R51 Headache: Secondary | ICD-10-CM | POA: Insufficient documentation

## 2016-02-14 LAB — COMPREHENSIVE METABOLIC PANEL
ALBUMIN: 4.2 g/dL (ref 3.5–5.0)
ALK PHOS: 64 U/L (ref 38–126)
ALT: 43 U/L (ref 17–63)
ANION GAP: 12 (ref 5–15)
AST: 33 U/L (ref 15–41)
BILIRUBIN TOTAL: 0.4 mg/dL (ref 0.3–1.2)
BUN: 14 mg/dL (ref 6–20)
CALCIUM: 8.4 mg/dL — AB (ref 8.9–10.3)
CO2: 22 mmol/L (ref 22–32)
Chloride: 104 mmol/L (ref 101–111)
Creatinine, Ser: 0.93 mg/dL (ref 0.61–1.24)
GFR calc Af Amer: >60 mL/min (ref >60)
GFR calc non Af Amer: >60 mL/min (ref >60)
GLUCOSE: 184 mg/dL — AB (ref 65–99)
Potassium: 3.2 mmol/L — ABNORMAL LOW (ref 3.5–5.1)
SODIUM: 138 mmol/L (ref 135–145)
TOTAL PROTEIN: 7.2 g/dL (ref 6.5–8.1)

## 2016-02-14 LAB — TROPONIN I: Troponin I: <0.03 ng/mL (ref <0.03)

## 2016-02-14 LAB — CBC WITH DIFFERENTIAL/PLATELET
BASOS ABS: 0 10*3/uL (ref 0–0.1)
Basophils Relative: 1 %
EOS PCT: 3 %
Eosinophils Absolute: 0.2 10*3/uL (ref 0–0.7)
HEMATOCRIT: 38.6 % — AB (ref 40.0–52.0)
HEMOGLOBIN: 13.4 g/dL (ref 13.0–18.0)
LYMPHS ABS: 2.3 10*3/uL (ref 1.0–3.6)
LYMPHS PCT: 29 %
MCH: 32.4 pg (ref 26.0–34.0)
MCHC: 34.7 g/dL (ref 32.0–36.0)
MCV: 93.5 fL (ref 80.0–100.0)
MONO ABS: 0.7 10*3/uL (ref 0.2–1.0)
MONOS PCT: 8 %
NEUTROS ABS: 4.8 10*3/uL (ref 1.4–6.5)
Neutrophils Relative %: 59 %
Platelets: 233 10*3/uL (ref 150–440)
RBC: 4.13 MIL/uL — AB (ref 4.40–5.90)
RDW: 12.5 % (ref 11.5–14.5)
WBC: 8 10*3/uL (ref 3.8–10.6)

## 2016-02-14 NOTE — ED Notes (Signed)
Patient transported to CT 

## 2016-02-14 NOTE — ED Triage Notes (Signed)
Pt arrived via ems for c/o syncope episode that caused pt to fall and hit the back of his head - pt has drank a pint of crown tonight which is not characteristic per pt - after hitting head ems reports a asystole episode of 4 seconds followed by nausea - pt denies vomiting and denies pain

## 2016-02-14 NOTE — ED Provider Notes (Signed)
[facility] Emergency Department Provider Note   ____________________________________________   I have reviewed the triage vital signs and the nursing notes.   HISTORY  Chief Complaint Loss of Consciousness   History limited by: Alcohol intoxication   HPI Edward Jensen is a 55 y.o. male who presents to the emergency department today because of concerns for a syncopal episode. The patient was apparently at his in-laws help in with a home improvement project. Per EMS his son saw him pass out and fall to the ground. He hit the back of his head on concrete. Patient does not remember passing out but does remember waking up staring at the grass. He states he has a little bit of a headache. He does admit to drinking a pint of liquor tonight. He states that while he normally drinks alcohol this was more than normal. He denied any chest pain or shortness of breath today. Denied any recent illnesses.   Past Medical History:  Diagnosis Date  . GERD (gastroesophageal reflux disease)   . H/O dislocation of elbow July 2012   s/p closed reduction in ER   . H/O renal calculi   . Hyperlipidemia   . Hypertension   . palpitations   . Sleep apnea    on CPAP  . Valvular disease     Patient Active Problem List   Diagnosis Date Noted  . Rectal pain 11/26/2012  . Multiple rib fractures 10/21/2012  . Renal cyst 10/21/2012  . Generalized anxiety disorder 04/20/2012  . Male erectile dysfunction 04/20/2012  . Primary hypertriglyceridemia 04/20/2012  . Metabolic syndrome 04/20/2012  . Abdominal hernia 02/04/2012  . H/O renal calculi   . H/O dislocation of elbow   . Hypertension 09/19/2011  . Arrhythmia     Past Surgical History:  Procedure Laterality Date  . CARDIAC CATHETERIZATION  May 2013    Prior to Admission medications   Medication Sig Start Date End Date Taking? Authorizing Provider  amLODipine (NORVASC) 10 MG tablet Take 10 mg by mouth daily.    Historical  Provider, MD  escitalopram (LEXAPRO) 10 MG tablet Take 10 mg by mouth daily.    Historical Provider, MD  hydrochlorothiazide (HYDRODIURIL) 25 MG tablet Take 25 mg by mouth daily.    Historical Provider, MD  hydrocortisone (ANUSOL-HC) 25 MG suppository PLACE 1 SUPPOSITORY (25 MG TOTAL) RECTALLY 2 (TWO) TIMES DAILY. Patient not taking: Reported on 10/21/2015 02/25/13   Earline MayotteJeffrey W Byrnett, MD  losartan (COZAAR) 50 MG tablet Take 50 mg by mouth daily. Take 1/2 tablet daily    Historical Provider, MD  metoprolol succinate (TOPROL-XL) 100 MG 24 hr tablet Take 100 mg by mouth daily. Take 1/2 tablet daily with or immediately following a meal.    Historical Provider, MD  pantoprazole (PROTONIX) 40 MG tablet Take 40 mg by mouth daily.    Historical Provider, MD  sertraline (ZOLOFT) 100 MG tablet Take 100 mg by mouth daily.    Historical Provider, MD  simvastatin (ZOCOR) 40 MG tablet Take 40 mg by mouth daily.    Historical Provider, MD    Allergies Review of patient's allergies indicates no known allergies.  Family History  Problem Relation Age of Onset  . Cancer Father     Lung  . Heart disease Father   . Heart attack Father   . Heart disease Paternal Grandfather   . Heart attack Mother     Social History Social History  Substance Use Topics  . Smoking status: Never  Smoker  . Smokeless tobacco: Current User    Types: Chew  . Alcohol use 2.5 oz/week    5 drink(s) per week     Comment: Regular    Review of Systems  Constitutional: Negative for fever. Cardiovascular: Negative for chest pain. Respiratory: Negative for shortness of breath. Gastrointestinal: Negative for abdominal pain, vomiting and diarrhea. Neurological: Positive for headache.  10-point ROS otherwise negative.  ____________________________________________   PHYSICAL EXAM:  VITAL SIGNS: ED Triage Vitals [02/14/16 2016]  Enc Vitals Group     BP 134/83     Pulse 78     Resp 25     Temp 97.8     Temp src       SpO2 94 %     Weight    Constitutional: Awake and alert. Appears intoxicated.  Eyes: Conjunctivae are normal. Normal extraocular movements. ENT   Head: Normocephalic. Small abrasion to back of head.    Nose: No congestion/rhinnorhea.   Mouth/Throat: Mucous membranes are moist.   Neck: No stridor. No midline tenderness.  Hematological/Lymphatic/Immunilogical: No cervical lymphadenopathy. Cardiovascular: Normal rate, regular rhythm.  No murmurs, rubs, or gallops. Respiratory: Normal respiratory effort without tachypnea nor retractions. Breath sounds are clear and equal bilaterally. No wheezes/rales/rhonchi. Gastrointestinal: Soft and nontender. No distention.  Genitourinary: Deferred Musculoskeletal: Normal range of motion in all extremities. No lower extremity edema. Neurologic:  Awake and alert. Appears intoxicated. Moving all extremities. Sensation grossly intact.  Skin:  Skin is warm, dry. Abrasion to back of head.   ____________________________________________    LABS (pertinent positives/negatives)  Labs Reviewed  CBC WITH DIFFERENTIAL/PLATELET - Abnormal; Notable for the following:       Result Value   RBC 4.13 (*)    HCT 38.6 (*)    All other components within normal limits  COMPREHENSIVE METABOLIC PANEL - Abnormal; Notable for the following:    Potassium 3.2 (*)    Glucose, Bld 184 (*)    Calcium 8.4 (*)    All other components within normal limits  TROPONIN I  TROPONIN I    2nd trop pending at time of signout ____________________________________________   EKG  Lurline Idol, attending physician, personally viewed and interpreted this EKG  EKG Time: 2013 Rate: 79 Rhythm: normal sinus rhythm with 1st degree av block Axis: normal Intervals: qtc 451, 1st degree av block QRS: narrow ST changes: no st elevation Impression: abnormal ekg  ____________________________________________    RADIOLOGY  CT head/c spine IMPRESSION:  No acute  intracranial pathology.    No acute/ traumatic cervical spine pathology.      PROCEDURES  Procedures  ____________________________________________   INITIAL IMPRESSION / ASSESSMENT AND PLAN / ED COURSE  Pertinent labs & imaging results that were available during my care of the patient were reviewed by me and considered in my medical decision making (see chart for details).  Patient presented to the emergency department today acutely intoxicated and after a syncopal episode. Patient does admit to drinking alcohol. Given that he is Coumadin toxic and does have evidence of head trauma or get a head and neck CT scan. Additionally will check a troponin given history of syncopal episode although I think likely secondary to alcohol.  CT head and neck without concerning findings. Will send second troponin. Patient will be signed out to oncoming doctor. ____________________________________________   FINAL CLINICAL IMPRESSION(S) / ED DIAGNOSES  Final diagnoses:  Syncope, unspecified syncope type     Note: This dictation was prepared with Dragon dictation. Any  transcriptional errors that result from this process are unintentional    Phineas Semen, MD 02/15/16 1611

## 2016-02-14 NOTE — ED Notes (Addendum)
Pt arrived via ems for c/o syncope episode that caused pt to fall and hit the back of his head - pt has drank a pint of crown tonight which is not characteristic per pt - after hitting head ems reports a asystole episode of 4 seconds followed by nausea - pt denies vomiting and denies pain - pt has no memory of syncopal episode or fall but at this time is A&O x4 yet does appear intoxicated

## 2016-02-15 LAB — TROPONIN I: Troponin I: <0.03 ng/mL (ref <0.03)

## 2016-02-15 MED ORDER — ACETAMINOPHEN 325 MG PO TABS
650.0000 mg | ORAL_TABLET | Freq: Once | ORAL | Status: AC
  Administered 2016-02-15: 650 mg via ORAL

## 2016-02-15 MED ORDER — ACETAMINOPHEN 325 MG PO TABS
ORAL_TABLET | ORAL | Status: AC
  Filled 2016-02-15: qty 2

## 2016-02-15 NOTE — ED Notes (Signed)
Pt discharged to home.  Family member driving.  Discharge instructions reviewed.  Verbalized understanding.  No questions or concerns at this time.  Teach back verified.  Pt in NAD.  No items left in ED.   

## 2016-02-15 NOTE — ED Provider Notes (Signed)
I assumed care of the patient from Dr. Derrill KayGoodman at 12:30 AM. Spoke with the patient who admits to drinking approximately a pint of [employer] tonight, in addition the patient states that he took 2 gabapentin when he is prescribed to take 1 before syncopal episode occurred. CT scan of the head revealed no gross abnormality. Troponin negative   Darci Currentandolph N Boleslaw Borghi, MD 02/15/16 0130

## 2016-03-16 ENCOUNTER — Other Ambulatory Visit: Payer: Self-pay | Admitting: Physician Assistant

## 2018-09-28 IMAGING — CT CT CERVICAL SPINE W/O CM
4 of 8 series · 11 of 33 positions shown, 12 images · non-contrast
Comparison: None.

CLINICAL DATA: 55-year-old male with fall

EXAM:
CT HEAD WITHOUT CONTRAST
CT CERVICAL SPINE WITHOUT CONTRAST
TECHNIQUE: Multidetector CT imaging of the head and cervical spine was
performed following the standard protocol without intravenous
contrast. Multiplanar CT image reconstructions of the cervical spine
were also generated.

[Series 7: c spine soft · axial · 0.36mm/px · z∈[+69,+179]mm · 3 of 111 slices shown]
[im 28/111  soft-tissue]
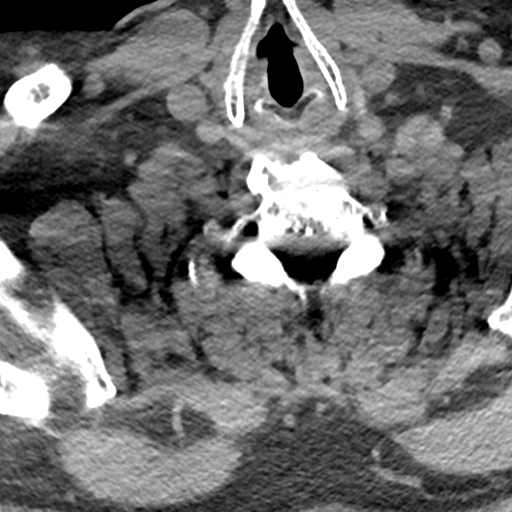
[im 56/111  soft-tissue]
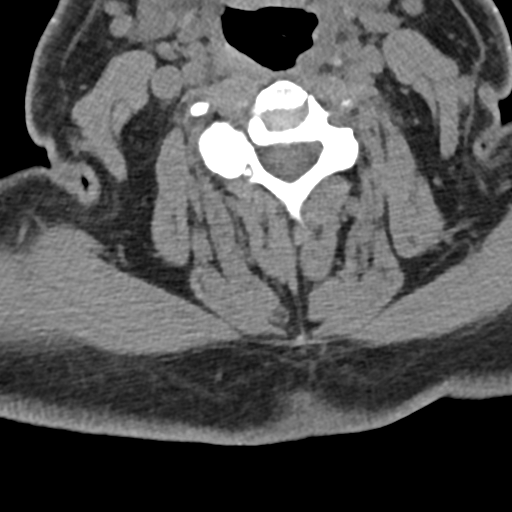
[im 83/111  soft-tissue]
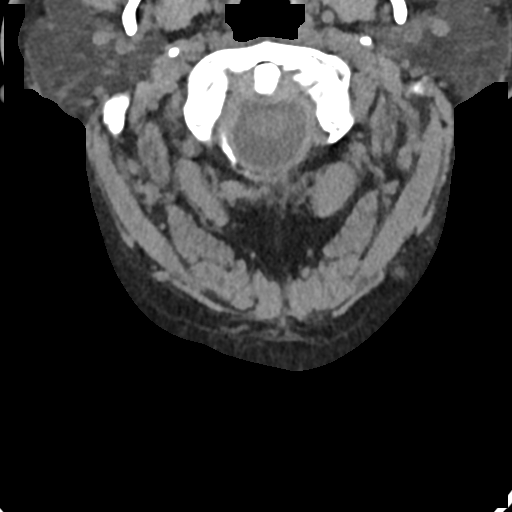

[Series 8: sagittal bone · sagittal · 0.32mm/px · 4 of 55 slices shown]
[im 11/55  bone]
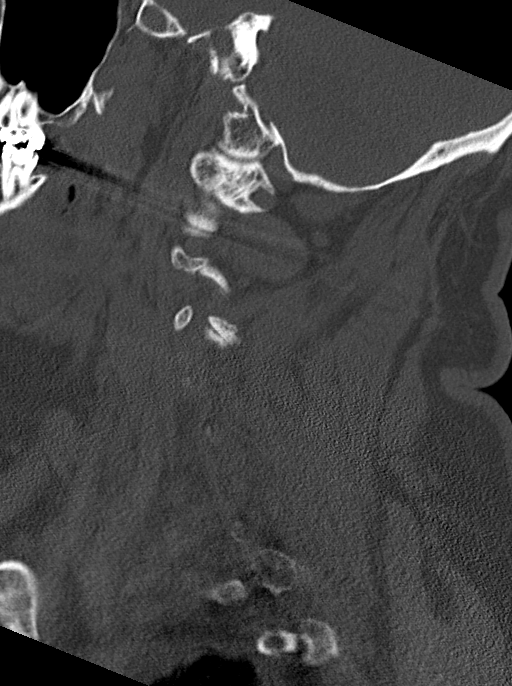
[im 22/55  bone]
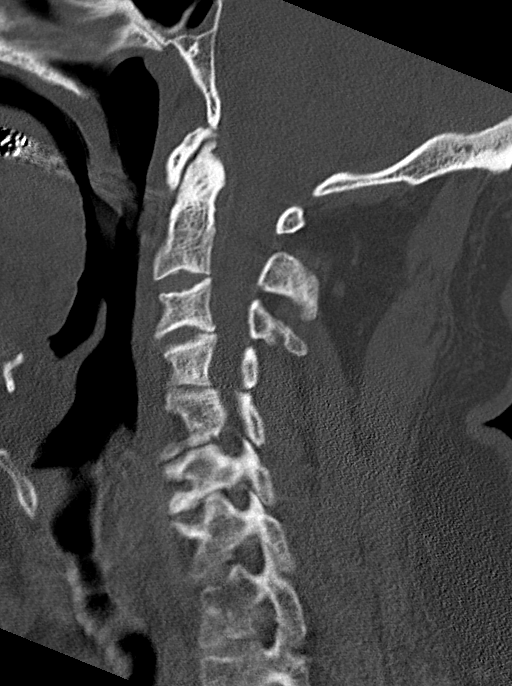
[im 33/55  bone]
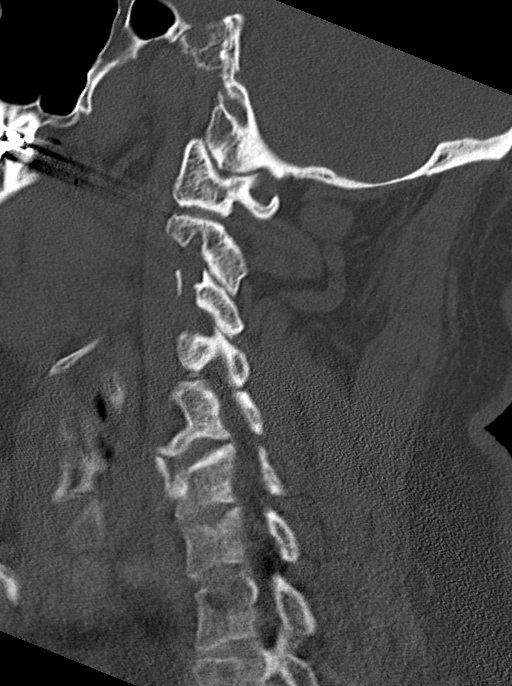
[im 44/55  bone]
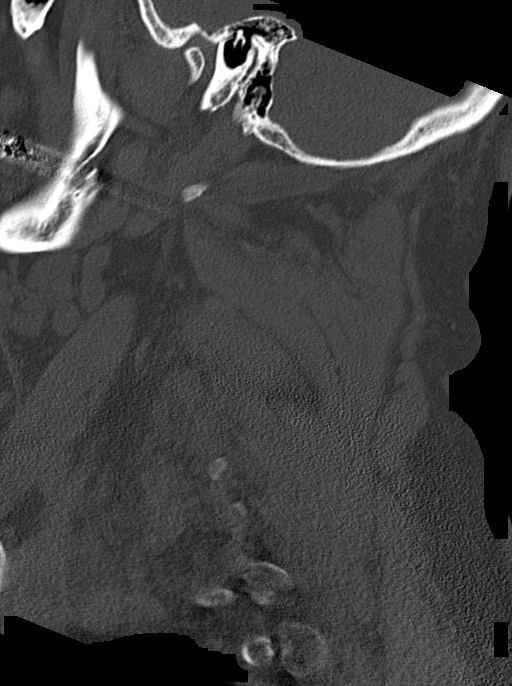

[Series 9: coronal bone · coronal · 0.32mm/px · 1 of 59 slices shown]
[im 30/59  bone]
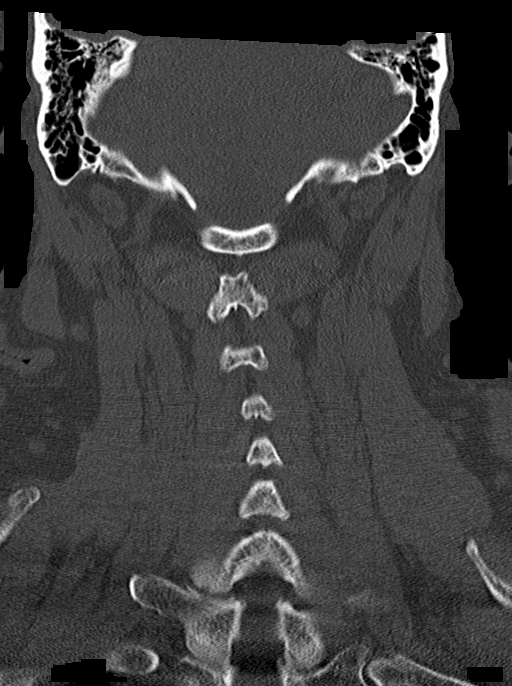

[Series 10: orthogonal bone · axial · 0.23mm/px · z∈[+43,+148]mm · 3 of 115 slices shown, 4 images]
[im 29/115  soft-tissue]
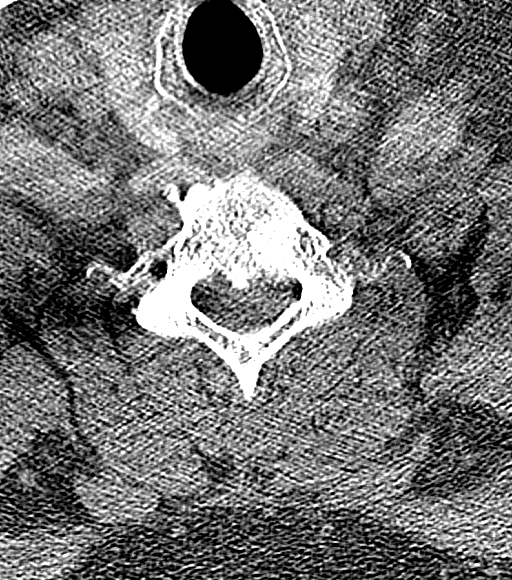
[im 29/115  bone]
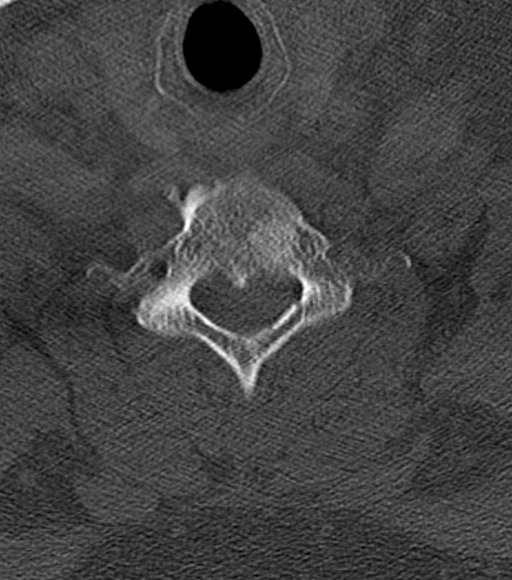
[im 58/115  bone]
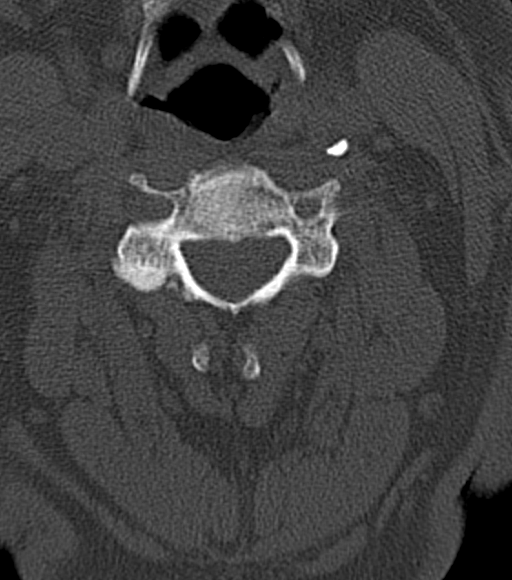
[im 86/115  bone]
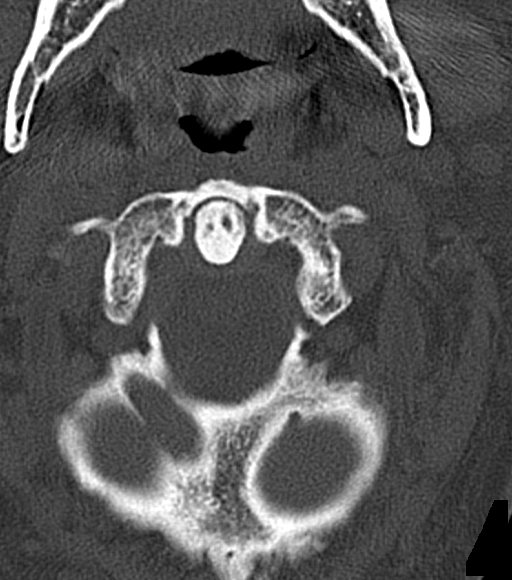

[11 of 33 positions shown; findings below may reference images not displayed]

FINDINGS: CT HEAD FINDINGS

Brain: No evidence of acute infarction, hemorrhage, hydrocephalus,
extra-axial collection or mass lesion/mass effect.

Vascular: No hyperdense vessel or unexpected calcification.

Skull: Normal. Negative for fracture or focal lesion.

Sinuses/Orbits: No acute finding.

Other: None

CT CERVICAL SPINE FINDINGS

Alignment: Normal.

Skull base and vertebrae: No definite acute fracture. Evaluation of
the lower cervical spine is somewhat limited due to osteopenia and
soft tissue attenuation.

Soft tissues and spinal canal: No prevertebral fluid or swelling. No
visible canal hematoma.

Disc levels: Multilevel degenerative changes with osteophyte most
prominent at C5-C6 and C6-7.

Upper chest: Negative.

Other: None
IMPRESSION: No acute intracranial pathology.

No acute/ traumatic cervical spine pathology.

## 2019-01-14 ENCOUNTER — Other Ambulatory Visit: Payer: Self-pay

## 2019-01-14 ENCOUNTER — Emergency Department
Admission: EM | Admit: 2019-01-14 | Discharge: 2019-01-14 | Disposition: A | Discharge status: Home / Self Care | Payer: No Typology Code available for payment source | Attending: Emergency Medicine | Admitting: Emergency Medicine

## 2019-01-14 DIAGNOSIS — I10 Essential (primary) hypertension: Secondary | ICD-10-CM | POA: Diagnosis not present

## 2019-01-14 DIAGNOSIS — R590 Localized enlarged lymph nodes: Secondary | ICD-10-CM | POA: Insufficient documentation

## 2019-01-14 DIAGNOSIS — R591 Generalized enlarged lymph nodes: Secondary | ICD-10-CM

## 2019-01-14 DIAGNOSIS — Z79899 Other long term (current) drug therapy: Secondary | ICD-10-CM | POA: Diagnosis not present

## 2019-01-14 LAB — CBC WITH DIFFERENTIAL/PLATELET
Abs Immature Granulocytes: 0.1 10*3/uL — ABNORMAL HIGH (ref 0.00–0.07)
Basophils Absolute: 0.1 10*3/uL (ref 0.0–0.1)
Basophils Relative: 1 %
Eosinophils Absolute: 0.1 10*3/uL (ref 0.0–0.5)
Eosinophils Relative: 2 %
HCT: 39.3 % (ref 39.0–52.0)
Hemoglobin: 13.7 g/dL (ref 13.0–17.0)
Immature Granulocytes: 1 %
Lymphocytes Relative: 20 %
Lymphs Abs: 1.5 10*3/uL (ref 0.7–4.0)
MCH: 31 pg (ref 26.0–34.0)
MCHC: 34.9 g/dL (ref 30.0–36.0)
MCV: 88.9 fL (ref 80.0–100.0)
Monocytes Absolute: 0.7 10*3/uL (ref 0.1–1.0)
Monocytes Relative: 10 %
Neutro Abs: 4.9 10*3/uL (ref 1.7–7.7)
Neutrophils Relative %: 66 %
Platelets: 231 10*3/uL (ref 150–400)
RBC: 4.42 MIL/uL (ref 4.22–5.81)
RDW: 12.9 % (ref 11.5–15.5)
WBC: 7.3 10*3/uL (ref 4.0–10.5)
nRBC: 0 % (ref 0.0–0.2)

## 2019-01-14 LAB — URINALYSIS, COMPLETE (UACMP) WITH MICROSCOPIC
Bacteria, UA: NONE SEEN
Bilirubin Urine: NEGATIVE
Glucose, UA: >=500 mg/dL — AB
Hgb urine dipstick: NEGATIVE
Ketones, ur: 5 mg/dL — AB
Leukocytes,Ua: NEGATIVE
Nitrite: NEGATIVE
Protein, ur: NEGATIVE mg/dL
Specific Gravity, Urine: 1.033 — ABNORMAL HIGH (ref 1.005–1.030)
Squamous Epithelial / LPF: NONE SEEN (ref 0–5)
pH: 6 (ref 5.0–8.0)

## 2019-01-14 LAB — COMPREHENSIVE METABOLIC PANEL
ALT: 28 U/L (ref 0–44)
AST: 22 U/L (ref 15–41)
Albumin: 3.7 g/dL (ref 3.5–5.0)
Alkaline Phosphatase: 84 U/L (ref 38–126)
Anion gap: 9 (ref 5–15)
BUN: 13 mg/dL (ref 6–20)
CO2: 20 mmol/L — ABNORMAL LOW (ref 22–32)
Calcium: 8 mg/dL — ABNORMAL LOW (ref 8.9–10.3)
Chloride: 100 mmol/L (ref 98–111)
Creatinine, Ser: 1.06 mg/dL (ref 0.61–1.24)
GFR calc Af Amer: >60 mL/min (ref >60)
GFR calc non Af Amer: >60 mL/min (ref >60)
Glucose, Bld: 424 mg/dL — ABNORMAL HIGH (ref 70–99)
Potassium: 3.7 mmol/L (ref 3.5–5.1)
Sodium: 129 mmol/L — ABNORMAL LOW (ref 135–145)
Total Bilirubin: 0.6 mg/dL (ref 0.3–1.2)
Total Protein: 7.3 g/dL (ref 6.5–8.1)

## 2019-01-14 MED ORDER — TRAMADOL HCL 50 MG PO TABS: 50.0000 mg | ORAL_TABLET | Freq: Four times a day (QID) | ORAL | 0 refills | Status: AC | PRN

## 2019-01-14 MED ORDER — SODIUM CHLORIDE 0.9% FLUSH: 3.0000 mL | Freq: Once | INTRAVENOUS | Status: DC

## 2019-01-14 NOTE — ED Triage Notes (Signed)
Pt c/o enlarged painful axillary lymph node for the past 8 days, was seen at [facility] last week for the same and is suppose to have abd/chest CT on Friday but states it has doubled in size since yesterday. States he is having night sweats. They did labs [place] came back neg but waiting on lyme.

## 2019-01-14 NOTE — ED Notes (Signed)
Pt has noticeable swelling under left arm, tender to touch, no redness noted

## 2019-01-14 NOTE — ED Provider Notes (Signed)
[facility] Emergency Department Provider Note   ____________________________________________    I have reviewed the triage vital signs and the nursing notes.   HISTORY  Chief Complaint Lymphadenopathy     HPI Edward Jensen is a 58 y.o. male with history as below who presents with complaints of a nodule in his left axilla.  Patient reports that he has a CT scan of his chest scheduled in 2 days for this.  He reports it has enlarged over the last several days and his wife became concerned with sending him to the emergency department.  He would prefer to wait for CT imaging and work-up as scheduled.  He reports it is sore but not overly painful.  No shortness of breath.  No fevers or chills.  No nausea or vomiting  Past Medical History:  Diagnosis Date  . GERD (gastroesophageal reflux disease)   . H/O dislocation of elbow July 2012   s/p closed reduction in ER   . H/O renal calculi   . Hyperlipidemia   . Hypertension   . palpitations   . Sleep apnea    on CPAP  . Valvular disease     Patient Active Problem List   Diagnosis Date Noted  . Rectal pain 11/26/2012  . Multiple rib fractures 10/21/2012  . Renal cyst 10/21/2012  . Generalized anxiety disorder 04/20/2012  . Male erectile dysfunction 04/20/2012  . Primary hypertriglyceridemia 04/20/2012  . Metabolic syndrome 87/56/4332  . Abdominal hernia 02/04/2012  . H/O renal calculi   . H/O dislocation of elbow   . Hypertension 09/19/2011  . Arrhythmia     Past Surgical History:  Procedure Laterality Date  . CARDIAC CATHETERIZATION  May 2013    Prior to Admission medications   Medication Sig Start Date End Date Taking? Authorizing Provider  amLODipine (NORVASC) 10 MG tablet Take 10 mg by mouth daily.    [provider]  escitalopram (LEXAPRO) 10 MG tablet Take 10 mg by mouth daily.    [provider]  hydrochlorothiazide (HYDRODIURIL) 25 MG tablet Take 25 mg by mouth  daily.    [provider]  losartan (COZAAR) 50 MG tablet Take 50 mg by mouth daily. Take 1/2 tablet daily    [provider]  metoprolol succinate (TOPROL-XL) 100 MG 24 hr tablet Take 100 mg by mouth daily. Take 1/2 tablet daily with or immediately following a meal.    [provider]  pantoprazole (PROTONIX) 40 MG tablet Take 40 mg by mouth daily.    [provider]  sertraline (ZOLOFT) 100 MG tablet Take 100 mg by mouth daily.    [provider]  simvastatin (ZOCOR) 40 MG tablet Take 40 mg by mouth daily.    [provider]  traMADol (ULTRAM) 50 MG tablet Take 1 tablet (50 mg total) by mouth every 6 (six) hours as needed. 01/14/19 01/14/20  Lavonia Drafts, MD     Allergies Patient has no known allergies.  Family History  Problem Relation Age of Onset  . Cancer Father        Lung  . Heart disease Father   . Heart attack Father   . Heart disease Paternal Grandfather   . Heart attack Mother     Social History Social History   Tobacco Use  . Smoking status: Never Smoker  . Smokeless tobacco: Current User    Types: Chew  Substance Use Topics  . Alcohol use: Yes    Alcohol/week: 5.0  standard drinks    Types: 5 Standard drinks or equivalent per week    Comment: Regular  . Drug use: No    Review of Systems  Constitutional: As above Eyes: No visual changes.  ENT: No sore throat. Cardiovascular: Denies chest pain. Respiratory: As above Gastrointestinal: No abdominal pain.   Genitourinary: Negative for dysuria. Musculoskeletal: Negative for back pain. Skin: Negative for rash. Neurological: Negative for headaches   ____________________________________________   PHYSICAL EXAM:  VITAL SIGNS: ED Triage Vitals  Enc Vitals Group     BP 01/14/19 1532 139/84     Pulse Rate 01/14/19 1532 98     Resp 01/14/19 1532 18     Temp 01/14/19 1532 98.6 F (37 C)     Temp Source 01/14/19 1532 Oral     SpO2 01/14/19 1532 96 %      Weight 01/14/19 1533 128.4 kg (283 lb)     Height 01/14/19 1533 1.93 m (6\' 4" )     Head Circumference --      Peak Flow --      Pain Score 01/14/19 1546 2     Pain Loc --      Pain Edu? --      Excl. in GC? --     Constitutional: Alert and oriented.  Eyes: Conjunctivae are normal.   Nose: No congestion/rhinnorhea. Mouth/Throat: Mucous membranes are moist.    Cardiovascular: Normal rate, regular rhythm.  Good peripheral circulation. Respiratory: Normal respiratory effort.  No retractions.  Gastrointestinal: Soft and nontender. No distention.    Musculoskeletal: No lower extremity tenderness nor edema.  Warm and well perfused Neurologic:  Normal speech and language. No gross focal neurologic deficits are appreciated.  Skin:  Skin is warm, dry and intact. No rash noted. Psychiatric: Mood and affect are normal. Speech and behavior are normal.  ____________________________________________   LABS (all labs ordered are listed, but only abnormal results are displayed)  Labs Reviewed  COMPREHENSIVE METABOLIC PANEL - Abnormal; Notable for the following components:      Result Value   Sodium 129 (*)    CO2 20 (*)    Glucose, Bld 424 (*)    Calcium 8.0 (*)    All other components within normal limits  CBC WITH DIFFERENTIAL/PLATELET - Abnormal; Notable for the following components:   Abs Immature Granulocytes 0.10 (*)    All other components within normal limits  URINALYSIS, COMPLETE (UACMP) WITH MICROSCOPIC - Abnormal; Notable for the following components:   Color, Urine YELLOW (*)    APPearance CLEAR (*)    Specific Gravity, Urine 1.033 (*)    Glucose, UA >=500 (*)    Ketones, ur 5 (*)    All other components within normal limits   ____________________________________________  EKG  None ____________________________________________  RADIOLOGY   ____________________________________________   PROCEDURES  Procedure(s) performed: No  Procedures   Critical Care  performed: No ____________________________________________   INITIAL IMPRESSION / ASSESSMENT AND PLAN / ED COURSE  Pertinent labs & imaging results that were available during my care of the patient were reviewed by me and considered in my medical decision making (see chart for details).  Patient presents with enlarging worrisome nodule/mass in the left axilla, likely significant lymphadenopathy, not consistent with abscess.  No history of smoking.  Recommended we go ahead and obtain CT scanning here in the emergency department but he states that he is happy to wait until scheduled scan on Friday of this week which is also a reasonable approach.  He does not want any further work-up at this time.    ____________________________________________   FINAL CLINICAL IMPRESSION(S) / ED DIAGNOSES  Final diagnoses:  Lymphadenopathy        Note:  This document was prepared using Dragon voice recognition software and may include unintentional dictation errors.   Jene EveryKinner, Davieon Stockham, MD 01/14/19 1705

## 2019-01-14 NOTE — ED Notes (Signed)
Pt sitting in bed speaking with this RN in NAD, A&Ox4. Reports lymph note under left arm has gotten bigger and is now tender. Pt reports night sweats, fatigue and joints aching, pt unsure if this started before he noticed the lymph node or after

## 2022-08-20 ENCOUNTER — Other Ambulatory Visit: Payer: Self-pay | Admitting: Cardiovascular Disease

## 2022-08-20 DIAGNOSIS — Z0000 Encounter for general adult medical examination without abnormal findings: Secondary | ICD-10-CM

## 2022-08-31 ENCOUNTER — Ambulatory Visit (INDEPENDENT_AMBULATORY_CARE_PROVIDER_SITE_OTHER): Payer: PRIVATE HEALTH INSURANCE

## 2022-08-31 DIAGNOSIS — Z0000 Encounter for general adult medical examination without abnormal findings: Secondary | ICD-10-CM
